# Patient Record
Sex: Male | Born: 1955 | Race: Black or African American | Hispanic: No | Marital: Married | Smoking: Never smoker
Health system: Southern US, Community
[De-identification: ages and names within clinical notes are randomized; demographics above are authoritative.]

## PROBLEM LIST (undated history)

## (undated) DIAGNOSIS — I10 Essential (primary) hypertension: Secondary | ICD-10-CM

## (undated) DIAGNOSIS — I499 Cardiac arrhythmia, unspecified: Secondary | ICD-10-CM

## (undated) DIAGNOSIS — N189 Chronic kidney disease, unspecified: Secondary | ICD-10-CM

## (undated) DIAGNOSIS — K219 Gastro-esophageal reflux disease without esophagitis: Secondary | ICD-10-CM

---

## 2013-10-16 ENCOUNTER — Inpatient Hospital Stay: Payer: Self-pay | Admitting: Internal Medicine

## 2013-10-16 LAB — CBC WITH DIFFERENTIAL/PLATELET
BASOS ABS: 0 10*3/uL (ref 0.0–0.1)
BASOS PCT: 0.3 %
EOS ABS: 0.1 10*3/uL (ref 0.0–0.7)
EOS PCT: 0.9 %
HCT: 42 % (ref 40.0–52.0)
HGB: 13.6 g/dL (ref 13.0–18.0)
LYMPHS PCT: 11.6 %
Lymphocyte #: 1.3 10*3/uL (ref 1.0–3.6)
MCH: 29.9 pg (ref 26.0–34.0)
MCHC: 32.5 g/dL (ref 32.0–36.0)
MCV: 92 fL (ref 80–100)
MONO ABS: 0.7 x10 3/mm (ref 0.2–1.0)
MONOS PCT: 6.1 %
NEUTROS PCT: 81.1 %
Neutrophil #: 8.9 10*3/uL — ABNORMAL HIGH (ref 1.4–6.5)
Platelet: 265 10*3/uL (ref 150–440)
RBC: 4.57 10*6/uL (ref 4.40–5.90)
RDW: 14.6 % — ABNORMAL HIGH (ref 11.5–14.5)
WBC: 10.9 10*3/uL — AB (ref 3.8–10.6)

## 2013-10-16 LAB — COMPREHENSIVE METABOLIC PANEL
AST: 24 U/L (ref 15–37)
Albumin: 3.9 g/dL (ref 3.4–5.0)
Alkaline Phosphatase: 159 U/L — ABNORMAL HIGH
Anion Gap: 9 (ref 7–16)
BUN: 84 mg/dL — AB (ref 7–18)
Bilirubin,Total: 1 mg/dL (ref 0.2–1.0)
CALCIUM: 9.2 mg/dL (ref 8.5–10.1)
Chloride: 90 mmol/L — ABNORMAL LOW (ref 98–107)
Co2: 39 mmol/L — ABNORMAL HIGH (ref 21–32)
Creatinine: 14.97 mg/dL — ABNORMAL HIGH (ref 0.60–1.30)
EGFR (African American): 4 — ABNORMAL LOW
EGFR (Non-African Amer.): 4 — ABNORMAL LOW
Glucose: 169 mg/dL — ABNORMAL HIGH (ref 65–99)
Osmolality: 305 (ref 275–301)
Potassium: 4.6 mmol/L (ref 3.5–5.1)
SGPT (ALT): 33 U/L
SODIUM: 138 mmol/L (ref 136–145)
Total Protein: 8.8 g/dL — ABNORMAL HIGH (ref 6.4–8.2)

## 2013-10-16 LAB — TROPONIN I: Troponin-I: 0.07 ng/mL — ABNORMAL HIGH

## 2013-10-16 LAB — PHENYTOIN LEVEL, TOTAL: DILANTIN: 15.9 ug/mL (ref 10.0–20.0)

## 2013-10-16 LAB — LIPASE, BLOOD: LIPASE: 568 U/L — AB (ref 73–393)

## 2013-10-16 LAB — PHOSPHORUS: PHOSPHORUS: 4 mg/dL (ref 2.5–4.9)

## 2013-10-19 LAB — CBC WITH DIFFERENTIAL/PLATELET
Basophil #: 0.1 10*3/uL (ref 0.0–0.1)
Basophil %: 0.5 %
EOS ABS: 0 10*3/uL (ref 0.0–0.7)
EOS PCT: 0.2 %
HCT: 46.1 % (ref 40.0–52.0)
HGB: 14.8 g/dL (ref 13.0–18.0)
Lymphocyte #: 1.7 10*3/uL (ref 1.0–3.6)
Lymphocyte %: 14.9 %
MCH: 29.7 pg (ref 26.0–34.0)
MCHC: 32.1 g/dL (ref 32.0–36.0)
MCV: 93 fL (ref 80–100)
MONO ABS: 0.9 x10 3/mm (ref 0.2–1.0)
MONOS PCT: 8.3 %
NEUTROS PCT: 76.1 %
Neutrophil #: 8.7 10*3/uL — ABNORMAL HIGH (ref 1.4–6.5)
Platelet: 282 10*3/uL (ref 150–440)
RBC: 4.97 10*6/uL (ref 4.40–5.90)
RDW: 14.7 % — ABNORMAL HIGH (ref 11.5–14.5)
WBC: 11.4 10*3/uL — AB (ref 3.8–10.6)

## 2013-10-19 LAB — PHENYTOIN LEVEL, TOTAL: Dilantin: 12.9 ug/mL (ref 10.0–20.0)

## 2013-10-19 LAB — CK TOTAL AND CKMB (NOT AT ARMC)
CK, Total: 80 U/L
CK-MB: 1.1 ng/mL (ref 0.5–3.6)

## 2013-10-19 LAB — LIPASE, BLOOD: LIPASE: 365 U/L (ref 73–393)

## 2013-10-19 LAB — TROPONIN I: Troponin-I: 0.08 ng/mL — ABNORMAL HIGH

## 2013-10-21 LAB — BASIC METABOLIC PANEL
ANION GAP: 15 (ref 7–16)
BUN: 50 mg/dL — ABNORMAL HIGH (ref 7–18)
CALCIUM: 9 mg/dL (ref 8.5–10.1)
CHLORIDE: 96 mmol/L — AB (ref 98–107)
CO2: 26 mmol/L (ref 21–32)
Creatinine: 9.63 mg/dL — ABNORMAL HIGH (ref 0.60–1.30)
GFR CALC AF AMER: 7 — AB
GFR CALC NON AF AMER: 6 — AB
Glucose: 198 mg/dL — ABNORMAL HIGH (ref 65–99)
OSMOLALITY: 293 (ref 275–301)
POTASSIUM: 5 mmol/L (ref 3.5–5.1)
SODIUM: 137 mmol/L (ref 136–145)

## 2013-10-21 LAB — CBC WITH DIFFERENTIAL/PLATELET
Basophil #: 0.1 10*3/uL (ref 0.0–0.1)
Basophil %: 0.3 %
Eosinophil #: 0 10*3/uL (ref 0.0–0.7)
Eosinophil %: 0 %
HCT: 44.7 % (ref 40.0–52.0)
HGB: 14.7 g/dL (ref 13.0–18.0)
Lymphocyte #: 1.2 10*3/uL (ref 1.0–3.6)
Lymphocyte %: 4.8 %
MCH: 29.8 pg (ref 26.0–34.0)
MCHC: 32.9 g/dL (ref 32.0–36.0)
MCV: 91 fL (ref 80–100)
MONOS PCT: 8.3 %
Monocyte #: 2 x10 3/mm — ABNORMAL HIGH (ref 0.2–1.0)
Neutrophil #: 21.2 10*3/uL — ABNORMAL HIGH (ref 1.4–6.5)
Neutrophil %: 86.6 %
PLATELETS: 345 10*3/uL (ref 150–440)
RBC: 4.92 10*6/uL (ref 4.40–5.90)
RDW: 14.7 % — ABNORMAL HIGH (ref 11.5–14.5)
WBC: 24.5 10*3/uL — AB (ref 3.8–10.6)

## 2013-10-21 LAB — GASTROCCULT (ARMC)
Occult Blood, Gastric: POSITIVE
Ph, Gastric: 4 (ref 1–3)

## 2013-10-21 LAB — RENAL FUNCTION PANEL
Albumin: 3.2 g/dL — ABNORMAL LOW (ref 3.4–5.0)
Anion Gap: 12 (ref 7–16)
BUN: 100 mg/dL — ABNORMAL HIGH (ref 7–18)
Calcium, Total: 8.9 mg/dL (ref 8.5–10.1)
Chloride: 87 mmol/L — ABNORMAL LOW (ref 98–107)
Co2: 33 mmol/L — ABNORMAL HIGH (ref 21–32)
Creatinine: 16.28 mg/dL — ABNORMAL HIGH (ref 0.60–1.30)
Glucose: 225 mg/dL — ABNORMAL HIGH (ref 65–99)
OSMOLALITY: 303 (ref 275–301)
POTASSIUM: 5.8 mmol/L — AB (ref 3.5–5.1)
Phosphorus: 4.8 mg/dL (ref 2.5–4.9)
SODIUM: 132 mmol/L — AB (ref 136–145)

## 2013-10-22 DIAGNOSIS — R079 Chest pain, unspecified: Secondary | ICD-10-CM

## 2013-10-22 LAB — BASIC METABOLIC PANEL
Anion Gap: 12 (ref 7–16)
BUN: 75 mg/dL — ABNORMAL HIGH (ref 7–18)
Calcium, Total: 8.9 mg/dL (ref 8.5–10.1)
Chloride: 96 mmol/L — ABNORMAL LOW (ref 98–107)
Co2: 30 mmol/L (ref 21–32)
Creatinine: 13.23 mg/dL — ABNORMAL HIGH (ref 0.60–1.30)
GLUCOSE: 217 mg/dL — AB (ref 65–99)
Osmolality: 305 (ref 275–301)
POTASSIUM: 5 mmol/L (ref 3.5–5.1)
Sodium: 138 mmol/L (ref 136–145)

## 2013-10-22 LAB — TROPONIN I
TROPONIN-I: 0.13 ng/mL — AB
Troponin-I: 0.13 ng/mL — ABNORMAL HIGH

## 2013-10-22 LAB — CK TOTAL AND CKMB (NOT AT ARMC)
CK, TOTAL: 162 U/L
CK, TOTAL: 177 U/L
CK-MB: 1.9 ng/mL (ref 0.5–3.6)
CK-MB: 1.8 ng/mL (ref 0.5–3.6)

## 2013-10-22 LAB — PHENYTOIN LEVEL, TOTAL: DILANTIN: 7.4 ug/mL — AB (ref 10.0–20.0)

## 2013-10-22 LAB — GLUCOSE, RANDOM: GLUCOSE: 182 mg/dL — AB (ref 65–99)

## 2013-10-23 LAB — CBC WITH DIFFERENTIAL/PLATELET
BASOS ABS: 0.1 10*3/uL (ref 0.0–0.1)
BASOS ABS: 0.2 10*3/uL — AB (ref 0.0–0.1)
Basophil %: 0.4 %
Basophil %: 0.7 %
EOS PCT: 0.3 %
Eosinophil #: 0 10*3/uL (ref 0.0–0.7)
Eosinophil #: 0.1 10*3/uL (ref 0.0–0.7)
Eosinophil %: 0.2 %
HCT: 34.8 % — ABNORMAL LOW (ref 40.0–52.0)
HCT: 35.1 % — ABNORMAL LOW (ref 40.0–52.0)
HGB: 11.3 g/dL — ABNORMAL LOW (ref 13.0–18.0)
HGB: 11 g/dL — ABNORMAL LOW (ref 13.0–18.0)
LYMPHS ABS: 2.3 10*3/uL (ref 1.0–3.6)
Lymphocyte #: 2.8 10*3/uL (ref 1.0–3.6)
Lymphocyte %: 10.3 %
Lymphocyte %: 12.3 %
MCH: 29.9 pg (ref 26.0–34.0)
MCH: 29.5 pg (ref 26.0–34.0)
MCHC: 32.6 g/dL (ref 32.0–36.0)
MCHC: 31.4 g/dL — ABNORMAL LOW (ref 32.0–36.0)
MCV: 92 fL (ref 80–100)
MCV: 94 fL (ref 80–100)
MONOS PCT: 8.8 %
MONOS PCT: 8.3 %
Monocyte #: 1.9 x10 3/mm — ABNORMAL HIGH (ref 0.2–1.0)
Monocyte #: 1.9 x10 3/mm — ABNORMAL HIGH (ref 0.2–1.0)
NEUTROS PCT: 80.3 %
Neutrophil #: 17.6 10*3/uL — ABNORMAL HIGH (ref 1.4–6.5)
Neutrophil #: 17.6 10*3/uL — ABNORMAL HIGH (ref 1.4–6.5)
Neutrophil %: 78.4 %
PLATELETS: 281 10*3/uL (ref 150–440)
PLATELETS: 280 10*3/uL (ref 150–440)
RBC: 3.8 10*6/uL — ABNORMAL LOW (ref 4.40–5.90)
RBC: 3.74 10*6/uL — AB (ref 4.40–5.90)
RDW: 14.5 % (ref 11.5–14.5)
RDW: 14.6 % — AB (ref 11.5–14.5)
WBC: 21.9 10*3/uL — ABNORMAL HIGH (ref 3.8–10.6)
WBC: 22.5 10*3/uL — AB (ref 3.8–10.6)

## 2013-10-23 LAB — CK TOTAL AND CKMB (NOT AT ARMC)
CK, Total: 154 U/L
CK-MB: 1.7 ng/mL (ref 0.5–3.6)

## 2013-10-23 LAB — RENAL FUNCTION PANEL
ALBUMIN: 2.6 g/dL — AB (ref 3.4–5.0)
Anion Gap: 13 (ref 7–16)
BUN: 83 mg/dL — ABNORMAL HIGH (ref 7–18)
CALCIUM: 8.8 mg/dL (ref 8.5–10.1)
CHLORIDE: 95 mmol/L — AB (ref 98–107)
CREATININE: 14.81 mg/dL — AB (ref 0.60–1.30)
Co2: 32 mmol/L (ref 21–32)
EGFR (African American): 4 — ABNORMAL LOW
GFR CALC NON AF AMER: 4 — AB
Glucose: 132 mg/dL — ABNORMAL HIGH (ref 65–99)
OSMOLALITY: 306 (ref 275–301)
PHOSPHORUS: 6.2 mg/dL — AB (ref 2.5–4.9)
Potassium: 4.7 mmol/L (ref 3.5–5.1)
Sodium: 140 mmol/L (ref 136–145)

## 2013-10-23 LAB — GLUCOSE, RANDOM
Glucose: 139 mg/dL — ABNORMAL HIGH (ref 65–99)
Glucose: 231 mg/dL — ABNORMAL HIGH (ref 65–99)

## 2013-10-23 LAB — TROPONIN I: TROPONIN-I: 0.12 ng/mL — AB

## 2013-10-23 LAB — RAPID HIV SCREEN (HIV 1/2 AB+AG)

## 2013-10-24 LAB — CBC WITH DIFFERENTIAL/PLATELET
BASOS PCT: 0.6 %
Basophil #: 0.1 10*3/uL (ref 0.0–0.1)
Eosinophil #: 0.2 10*3/uL (ref 0.0–0.7)
Eosinophil %: 1 %
HCT: 29.5 % — ABNORMAL LOW (ref 40.0–52.0)
HGB: 9.5 g/dL — ABNORMAL LOW (ref 13.0–18.0)
LYMPHS PCT: 12.4 %
Lymphocyte #: 1.9 10*3/uL (ref 1.0–3.6)
MCH: 30 pg (ref 26.0–34.0)
MCHC: 32.3 g/dL (ref 32.0–36.0)
MCV: 93 fL (ref 80–100)
MONO ABS: 1.3 x10 3/mm — AB (ref 0.2–1.0)
MONOS PCT: 8.1 %
NEUTROS ABS: 12.1 10*3/uL — AB (ref 1.4–6.5)
NEUTROS PCT: 77.9 %
Platelet: 240 10*3/uL (ref 150–440)
RBC: 3.18 10*6/uL — ABNORMAL LOW (ref 4.40–5.90)
RDW: 14.1 % (ref 11.5–14.5)
WBC: 15.5 10*3/uL — ABNORMAL HIGH (ref 3.8–10.6)

## 2013-10-24 LAB — PHENYTOIN LEVEL, TOTAL: Dilantin: 8.4 ug/mL — ABNORMAL LOW (ref 10.0–20.0)

## 2013-10-25 LAB — CBC WITH DIFFERENTIAL/PLATELET
Basophil #: 0 10*3/uL (ref 0.0–0.1)
Basophil %: 0.2 %
Eosinophil #: 0.2 10*3/uL (ref 0.0–0.7)
Eosinophil %: 1.4 %
HCT: 28.4 % — AB (ref 40.0–52.0)
HGB: 9.2 g/dL — ABNORMAL LOW (ref 13.0–18.0)
LYMPHS ABS: 2.1 10*3/uL (ref 1.0–3.6)
Lymphocyte %: 14 %
MCH: 30.1 pg (ref 26.0–34.0)
MCHC: 32.6 g/dL (ref 32.0–36.0)
MCV: 92 fL (ref 80–100)
Monocyte #: 1.6 x10 3/mm — ABNORMAL HIGH (ref 0.2–1.0)
Monocyte %: 10.9 %
Neutrophil #: 11.1 10*3/uL — ABNORMAL HIGH (ref 1.4–6.5)
Neutrophil %: 73.5 %
Platelet: 242 10*3/uL (ref 150–440)
RBC: 3.07 10*6/uL — AB (ref 4.40–5.90)
RDW: 14.1 % (ref 11.5–14.5)
WBC: 15.1 10*3/uL — ABNORMAL HIGH (ref 3.8–10.6)

## 2013-10-25 LAB — RENAL FUNCTION PANEL
ALBUMIN: 2.2 g/dL — AB (ref 3.4–5.0)
Anion Gap: 11 (ref 7–16)
BUN: 81 mg/dL — ABNORMAL HIGH (ref 7–18)
CALCIUM: 8 mg/dL — AB (ref 8.5–10.1)
CHLORIDE: 96 mmol/L — AB (ref 98–107)
CREATININE: 13.15 mg/dL — AB (ref 0.60–1.30)
Co2: 30 mmol/L (ref 21–32)
EGFR (African American): 5 — ABNORMAL LOW
GFR CALC NON AF AMER: 4 — AB
Glucose: 177 mg/dL — ABNORMAL HIGH (ref 65–99)
Osmolality: 303 (ref 275–301)
PHOSPHORUS: 4.4 mg/dL (ref 2.5–4.9)
Potassium: 3.8 mmol/L (ref 3.5–5.1)
Sodium: 137 mmol/L (ref 136–145)

## 2013-10-25 LAB — PHENYTOIN LEVEL, TOTAL: DILANTIN: 8.8 ug/mL — AB (ref 10.0–20.0)

## 2013-11-01 ENCOUNTER — Emergency Department: Payer: Self-pay | Admitting: Student

## 2013-11-01 LAB — BASIC METABOLIC PANEL
ANION GAP: 9 (ref 7–16)
BUN: 35 mg/dL — ABNORMAL HIGH (ref 7–18)
CHLORIDE: 103 mmol/L (ref 98–107)
Calcium, Total: 7.7 mg/dL — ABNORMAL LOW (ref 8.5–10.1)
Co2: 28 mmol/L (ref 21–32)
Creatinine: 10.01 mg/dL — ABNORMAL HIGH (ref 0.60–1.30)
EGFR (African American): 7 — ABNORMAL LOW
EGFR (Non-African Amer.): 6 — ABNORMAL LOW
Glucose: 110 mg/dL — ABNORMAL HIGH (ref 65–99)
OSMOLALITY: 288 (ref 275–301)
Potassium: 4.2 mmol/L (ref 3.5–5.1)
SODIUM: 140 mmol/L (ref 136–145)

## 2013-11-01 LAB — CBC
HCT: 26 % — AB (ref 40.0–52.0)
HGB: 8.2 g/dL — AB (ref 13.0–18.0)
MCH: 29.5 pg (ref 26.0–34.0)
MCHC: 31.5 g/dL — AB (ref 32.0–36.0)
MCV: 94 fL (ref 80–100)
Platelet: 422 10*3/uL (ref 150–440)
RBC: 2.77 10*6/uL — AB (ref 4.40–5.90)
RDW: 14.4 % (ref 11.5–14.5)
WBC: 10.5 10*3/uL (ref 3.8–10.6)

## 2013-11-05 LAB — CBC WITH DIFFERENTIAL/PLATELET
Basophil #: 0.1 10*3/uL (ref 0.0–0.1)
Basophil %: 0.8 %
EOS PCT: 2.2 %
Eosinophil #: 0.2 10*3/uL (ref 0.0–0.7)
HCT: 28.7 % — AB (ref 40.0–52.0)
HGB: 9.1 g/dL — AB (ref 13.0–18.0)
LYMPHS PCT: 18.6 %
Lymphocyte #: 1.7 10*3/uL (ref 1.0–3.6)
MCH: 30 pg (ref 26.0–34.0)
MCHC: 31.9 g/dL — ABNORMAL LOW (ref 32.0–36.0)
MCV: 94 fL (ref 80–100)
Monocyte #: 0.8 x10 3/mm (ref 0.2–1.0)
Monocyte %: 9.4 %
NEUTROS PCT: 69 %
Neutrophil #: 6.2 10*3/uL (ref 1.4–6.5)
Platelet: 513 10*3/uL — ABNORMAL HIGH (ref 150–440)
RBC: 3.04 10*6/uL — ABNORMAL LOW (ref 4.40–5.90)
RDW: 15 % — ABNORMAL HIGH (ref 11.5–14.5)
WBC: 8.9 10*3/uL (ref 3.8–10.6)

## 2013-11-06 ENCOUNTER — Inpatient Hospital Stay: Payer: Self-pay | Admitting: Specialist

## 2013-11-06 LAB — COMPREHENSIVE METABOLIC PANEL
ALT: 65 U/L — AB
ANION GAP: 10 (ref 7–16)
Albumin: 2.8 g/dL — ABNORMAL LOW (ref 3.4–5.0)
Alkaline Phosphatase: 152 U/L — ABNORMAL HIGH
BILIRUBIN TOTAL: 0.3 mg/dL (ref 0.2–1.0)
BUN: 40 mg/dL — AB (ref 7–18)
CALCIUM: 8 mg/dL — AB (ref 8.5–10.1)
CREATININE: 10.98 mg/dL — AB (ref 0.60–1.30)
Chloride: 110 mmol/L — ABNORMAL HIGH (ref 98–107)
Co2: 20 mmol/L — ABNORMAL LOW (ref 21–32)
GFR CALC AF AMER: 6 — AB
GFR CALC NON AF AMER: 5 — AB
GLUCOSE: 104 mg/dL — AB (ref 65–99)
OSMOLALITY: 289 (ref 275–301)
Potassium: 4.6 mmol/L (ref 3.5–5.1)
SGOT(AST): 29 U/L (ref 15–37)
SODIUM: 140 mmol/L (ref 136–145)
TOTAL PROTEIN: 7.8 g/dL (ref 6.4–8.2)

## 2013-11-06 LAB — PHENYTOIN LEVEL, TOTAL: DILANTIN: 26.7 ug/mL — AB (ref 10.0–20.0)

## 2013-11-06 LAB — TROPONIN I: TROPONIN-I: 0.04 ng/mL

## 2013-11-06 LAB — PHOSPHORUS: PHOSPHORUS: 4.1 mg/dL (ref 2.5–4.9)

## 2013-11-12 ENCOUNTER — Emergency Department: Payer: Self-pay | Admitting: Emergency Medicine

## 2013-11-12 LAB — COMPREHENSIVE METABOLIC PANEL
ALT: 39 U/L
Albumin: 2.7 g/dL — ABNORMAL LOW (ref 3.4–5.0)
Alkaline Phosphatase: 177 U/L — ABNORMAL HIGH
Anion Gap: 8 (ref 7–16)
BUN: 24 mg/dL — ABNORMAL HIGH (ref 7–18)
Bilirubin,Total: 0.3 mg/dL (ref 0.2–1.0)
CHLORIDE: 104 mmol/L (ref 98–107)
CO2: 25 mmol/L (ref 21–32)
Calcium, Total: 8.1 mg/dL — ABNORMAL LOW (ref 8.5–10.1)
Creatinine: 8.23 mg/dL — ABNORMAL HIGH (ref 0.60–1.30)
Glucose: 189 mg/dL — ABNORMAL HIGH (ref 65–99)
Potassium: 5.5 mmol/L — ABNORMAL HIGH (ref 3.5–5.1)
SGOT(AST): 51 U/L — ABNORMAL HIGH (ref 15–37)
Sodium: 137 mmol/L (ref 136–145)
TOTAL PROTEIN: 7.2 g/dL (ref 6.4–8.2)

## 2013-11-12 LAB — CBC
HCT: 30 % — ABNORMAL LOW (ref 40.0–52.0)
HGB: 9.5 g/dL — ABNORMAL LOW (ref 13.0–18.0)
MCH: 30.1 pg (ref 26.0–34.0)
MCHC: 31.7 g/dL — ABNORMAL LOW (ref 32.0–36.0)
MCV: 95 fL (ref 80–100)
Platelet: 357 10*3/uL (ref 150–440)
RBC: 3.16 10*6/uL — ABNORMAL LOW (ref 4.40–5.90)
RDW: 15.1 % — ABNORMAL HIGH (ref 11.5–14.5)
WBC: 6.7 10*3/uL (ref 3.8–10.6)

## 2013-11-12 LAB — TROPONIN I: Troponin-I: 0.05 ng/mL

## 2013-11-15 ENCOUNTER — Emergency Department: Payer: Self-pay | Admitting: Emergency Medicine

## 2013-11-15 LAB — COMPREHENSIVE METABOLIC PANEL
ALK PHOS: 178 U/L — AB
ANION GAP: 9 (ref 7–16)
AST: 28 U/L (ref 15–37)
Albumin: 2.5 g/dL — ABNORMAL LOW (ref 3.4–5.0)
BUN: 44 mg/dL — ABNORMAL HIGH (ref 7–18)
Bilirubin,Total: 0.3 mg/dL (ref 0.2–1.0)
CHLORIDE: 106 mmol/L (ref 98–107)
Calcium, Total: 8.1 mg/dL — ABNORMAL LOW (ref 8.5–10.1)
Co2: 25 mmol/L (ref 21–32)
Creatinine: 13.66 mg/dL — ABNORMAL HIGH (ref 0.60–1.30)
GFR CALC AF AMER: 5 — AB
GFR CALC NON AF AMER: 4 — AB
GLUCOSE: 196 mg/dL — AB (ref 65–99)
Osmolality: 296 (ref 275–301)
Potassium: 4.8 mmol/L (ref 3.5–5.1)
SGPT (ALT): 30 U/L
SODIUM: 140 mmol/L (ref 136–145)
Total Protein: 7.2 g/dL (ref 6.4–8.2)

## 2013-11-15 LAB — CBC
HCT: 25.4 % — AB (ref 40.0–52.0)
HGB: 8.2 g/dL — ABNORMAL LOW (ref 13.0–18.0)
MCH: 30.3 pg (ref 26.0–34.0)
MCHC: 32.2 g/dL (ref 32.0–36.0)
MCV: 94 fL (ref 80–100)
Platelet: 348 10*3/uL (ref 150–440)
RBC: 2.7 10*6/uL — ABNORMAL LOW (ref 4.40–5.90)
RDW: 15.1 % — AB (ref 11.5–14.5)
WBC: 7 10*3/uL (ref 3.8–10.6)

## 2013-11-15 LAB — TROPONIN I: TROPONIN-I: 0.05 ng/mL

## 2013-11-16 ENCOUNTER — Inpatient Hospital Stay: Payer: Self-pay | Admitting: Otolaryngology

## 2013-11-16 LAB — COMPREHENSIVE METABOLIC PANEL
ALK PHOS: 191 U/L — AB
Albumin: 2.5 g/dL — ABNORMAL LOW (ref 3.4–5.0)
Anion Gap: 10 (ref 7–16)
BILIRUBIN TOTAL: 0.3 mg/dL (ref 0.2–1.0)
BUN: 52 mg/dL — ABNORMAL HIGH (ref 7–18)
CHLORIDE: 107 mmol/L (ref 98–107)
Calcium, Total: 8.2 mg/dL — ABNORMAL LOW (ref 8.5–10.1)
Co2: 21 mmol/L (ref 21–32)
Creatinine: 14.83 mg/dL — ABNORMAL HIGH (ref 0.60–1.30)
EGFR (African American): 4 — ABNORMAL LOW
EGFR (Non-African Amer.): 4 — ABNORMAL LOW
Glucose: 120 mg/dL — ABNORMAL HIGH (ref 65–99)
OSMOLALITY: 291 (ref 275–301)
POTASSIUM: 5.8 mmol/L — AB (ref 3.5–5.1)
SGOT(AST): 42 U/L — ABNORMAL HIGH (ref 15–37)
SGPT (ALT): 31 U/L
Sodium: 138 mmol/L (ref 136–145)
Total Protein: 7.5 g/dL (ref 6.4–8.2)

## 2013-11-16 LAB — CBC WITH DIFFERENTIAL/PLATELET
BASOS ABS: 0.1 10*3/uL (ref 0.0–0.1)
BASOS PCT: 0.7 %
EOS ABS: 0.4 10*3/uL (ref 0.0–0.7)
EOS PCT: 4.9 %
HCT: 27.5 % — ABNORMAL LOW (ref 40.0–52.0)
HGB: 8.9 g/dL — AB (ref 13.0–18.0)
LYMPHS PCT: 25.9 %
Lymphocyte #: 2 10*3/uL (ref 1.0–3.6)
MCH: 30.4 pg (ref 26.0–34.0)
MCHC: 32.5 g/dL (ref 32.0–36.0)
MCV: 94 fL (ref 80–100)
MONO ABS: 1 x10 3/mm (ref 0.2–1.0)
MONOS PCT: 12.2 %
Neutrophil #: 4.4 10*3/uL (ref 1.4–6.5)
Neutrophil %: 56.3 %
PLATELETS: 415 10*3/uL (ref 150–440)
RBC: 2.93 10*6/uL — ABNORMAL LOW (ref 4.40–5.90)
RDW: 15.4 % — AB (ref 11.5–14.5)
WBC: 7.8 10*3/uL (ref 3.8–10.6)

## 2013-11-17 LAB — COMPREHENSIVE METABOLIC PANEL
ALT: 24 U/L
ANION GAP: 10 (ref 7–16)
Albumin: 2.2 g/dL — ABNORMAL LOW (ref 3.4–5.0)
Alkaline Phosphatase: 173 U/L — ABNORMAL HIGH
BILIRUBIN TOTAL: 0.3 mg/dL (ref 0.2–1.0)
BUN: 62 mg/dL — ABNORMAL HIGH (ref 7–18)
CHLORIDE: 107 mmol/L (ref 98–107)
CO2: 20 mmol/L — AB (ref 21–32)
Calcium, Total: 8 mg/dL — ABNORMAL LOW (ref 8.5–10.1)
Creatinine: 16.24 mg/dL — ABNORMAL HIGH (ref 0.60–1.30)
EGFR (African American): 4 — ABNORMAL LOW
EGFR (Non-African Amer.): 3 — ABNORMAL LOW
Glucose: 95 mg/dL (ref 65–99)
Osmolality: 291 (ref 275–301)
Potassium: 5.4 mmol/L — ABNORMAL HIGH (ref 3.5–5.1)
SGOT(AST): 21 U/L (ref 15–37)
SODIUM: 137 mmol/L (ref 136–145)
TOTAL PROTEIN: 6.4 g/dL (ref 6.4–8.2)

## 2013-11-17 LAB — CBC WITH DIFFERENTIAL/PLATELET
Basophil #: 0 10*3/uL (ref 0.0–0.1)
Basophil %: 0.3 %
EOS ABS: 0 10*3/uL (ref 0.0–0.7)
EOS PCT: 0 %
HCT: 23.4 % — ABNORMAL LOW (ref 40.0–52.0)
HGB: 7.7 g/dL — ABNORMAL LOW (ref 13.0–18.0)
LYMPHS ABS: 0.6 10*3/uL — AB (ref 1.0–3.6)
Lymphocyte %: 8.7 %
MCH: 30.4 pg (ref 26.0–34.0)
MCHC: 33 g/dL (ref 32.0–36.0)
MCV: 92 fL (ref 80–100)
Monocyte #: 0.2 x10 3/mm (ref 0.2–1.0)
Monocyte %: 3.5 %
Neutrophil #: 5.6 10*3/uL (ref 1.4–6.5)
Neutrophil %: 87.5 %
Platelet: 329 10*3/uL (ref 150–440)
RBC: 2.54 10*6/uL — ABNORMAL LOW (ref 4.40–5.90)
RDW: 15 % — ABNORMAL HIGH (ref 11.5–14.5)
WBC: 6.4 10*3/uL (ref 3.8–10.6)

## 2013-11-17 LAB — PHENYTOIN LEVEL, TOTAL: Dilantin: 24.8 ug/mL — ABNORMAL HIGH (ref 10.0–20.0)

## 2013-11-17 LAB — PHOSPHORUS: PHOSPHORUS: 4.1 mg/dL (ref 2.5–4.9)

## 2013-11-17 LAB — TRIGLYCERIDES: TRIGLYCERIDES: 212 mg/dL — AB (ref 0–200)

## 2013-11-17 LAB — MAGNESIUM: Magnesium: 2.7 mg/dL — ABNORMAL HIGH

## 2013-11-18 LAB — RENAL FUNCTION PANEL
ALBUMIN: 2.3 g/dL — AB (ref 3.4–5.0)
ANION GAP: 15 (ref 7–16)
BUN: 78 mg/dL — ABNORMAL HIGH (ref 7–18)
CALCIUM: 8.1 mg/dL — AB (ref 8.5–10.1)
CHLORIDE: 103 mmol/L (ref 98–107)
CREATININE: 17.26 mg/dL — AB (ref 0.60–1.30)
Co2: 19 mmol/L — ABNORMAL LOW (ref 21–32)
EGFR (African American): 4 — ABNORMAL LOW
EGFR (Non-African Amer.): 3 — ABNORMAL LOW
GLUCOSE: 103 mg/dL — AB (ref 65–99)
OSMOLALITY: 297 (ref 275–301)
POTASSIUM: 5.2 mmol/L — AB (ref 3.5–5.1)
Phosphorus: 5.7 mg/dL — ABNORMAL HIGH (ref 2.5–4.9)
SODIUM: 137 mmol/L (ref 136–145)

## 2013-11-18 LAB — CBC WITH DIFFERENTIAL/PLATELET
BASOS ABS: 0 10*3/uL (ref 0.0–0.1)
BASOS ABS: 0 10*3/uL (ref 0.0–0.1)
Basophil %: 0.3 %
Basophil %: 0.4 %
EOS PCT: 0.1 %
Eosinophil #: 0 10*3/uL (ref 0.0–0.7)
Eosinophil #: 0 10*3/uL (ref 0.0–0.7)
Eosinophil %: 0 %
HCT: 27.5 % — AB (ref 40.0–52.0)
HCT: 30.4 % — ABNORMAL LOW (ref 40.0–52.0)
HGB: 9 g/dL — ABNORMAL LOW (ref 13.0–18.0)
HGB: 9.7 g/dL — ABNORMAL LOW (ref 13.0–18.0)
LYMPHS ABS: 1 10*3/uL (ref 1.0–3.6)
LYMPHS PCT: 6.2 %
Lymphocyte #: 0.5 10*3/uL — ABNORMAL LOW (ref 1.0–3.6)
Lymphocyte %: 13.6 %
MCH: 30.4 pg (ref 26.0–34.0)
MCH: 29.9 pg (ref 26.0–34.0)
MCHC: 32.7 g/dL (ref 32.0–36.0)
MCHC: 31.8 g/dL — AB (ref 32.0–36.0)
MCV: 93 fL (ref 80–100)
MCV: 94 fL (ref 80–100)
MONOS PCT: 6.8 %
Monocyte #: 0.5 x10 3/mm (ref 0.2–1.0)
Monocyte #: 0.3 x10 3/mm (ref 0.2–1.0)
Monocyte %: 3.7 %
NEUTROS ABS: 7.8 10*3/uL — AB (ref 1.4–6.5)
Neutrophil #: 5.7 10*3/uL (ref 1.4–6.5)
Neutrophil %: 79.2 %
Neutrophil %: 89.7 %
Platelet: 393 10*3/uL (ref 150–440)
Platelet: 467 10*3/uL — ABNORMAL HIGH (ref 150–440)
RBC: 2.95 10*6/uL — ABNORMAL LOW (ref 4.40–5.90)
RBC: 3.24 10*6/uL — AB (ref 4.40–5.90)
RDW: 15.1 % — ABNORMAL HIGH (ref 11.5–14.5)
RDW: 15.1 % — ABNORMAL HIGH (ref 11.5–14.5)
WBC: 7.2 10*3/uL (ref 3.8–10.6)
WBC: 8.7 10*3/uL (ref 3.8–10.6)

## 2013-11-18 LAB — IRON AND TIBC
IRON BIND. CAP.(TOTAL): 128 ug/dL — AB (ref 250–450)
IRON: 76 ug/dL (ref 65–175)
Iron Saturation: 59 %
Unbound Iron-Bind.Cap.: 52 ug/dL

## 2013-11-18 LAB — BASIC METABOLIC PANEL
Anion Gap: 14 (ref 7–16)
BUN: 79 mg/dL — AB (ref 7–18)
CHLORIDE: 104 mmol/L (ref 98–107)
CREATININE: 17.14 mg/dL — AB (ref 0.60–1.30)
Calcium, Total: 8.1 mg/dL — ABNORMAL LOW (ref 8.5–10.1)
Co2: 19 mmol/L — ABNORMAL LOW (ref 21–32)
EGFR (African American): 4 — ABNORMAL LOW
GFR CALC NON AF AMER: 3 — AB
Glucose: 106 mg/dL — ABNORMAL HIGH (ref 65–99)
Osmolality: 298 (ref 275–301)
POTASSIUM: 5.2 mmol/L — AB (ref 3.5–5.1)
Sodium: 137 mmol/L (ref 136–145)

## 2013-11-18 LAB — FERRITIN: Ferritin (ARMC): 809 ng/mL — ABNORMAL HIGH (ref 8–388)

## 2013-11-18 LAB — PHENYTOIN LEVEL, TOTAL: Dilantin: 24.3 ug/mL — ABNORMAL HIGH (ref 10.0–20.0)

## 2013-11-18 LAB — MAGNESIUM: MAGNESIUM: 2.8 mg/dL — AB

## 2013-11-18 LAB — PHOSPHORUS: Phosphorus: 5.7 mg/dL — ABNORMAL HIGH (ref 2.5–4.9)

## 2013-11-18 LAB — ALBUMIN: Albumin: 2.3 g/dL — ABNORMAL LOW (ref 3.4–5.0)

## 2013-11-19 LAB — CBC WITH DIFFERENTIAL/PLATELET
Basophil #: 0.1 10*3/uL (ref 0.0–0.1)
Basophil %: 0.8 %
Eosinophil #: 0 10*3/uL (ref 0.0–0.7)
Eosinophil %: 0.1 %
HCT: 27.2 % — ABNORMAL LOW (ref 40.0–52.0)
HGB: 9.2 g/dL — AB (ref 13.0–18.0)
LYMPHS ABS: 0.9 10*3/uL — AB (ref 1.0–3.6)
Lymphocyte %: 9.2 %
MCH: 31 pg (ref 26.0–34.0)
MCHC: 33.8 g/dL (ref 32.0–36.0)
MCV: 92 fL (ref 80–100)
Monocyte #: 1.1 x10 3/mm — ABNORMAL HIGH (ref 0.2–1.0)
Monocyte %: 11.1 %
Neutrophil #: 8.1 10*3/uL — ABNORMAL HIGH (ref 1.4–6.5)
Neutrophil %: 78.8 %
PLATELETS: 388 10*3/uL (ref 150–440)
RBC: 2.97 10*6/uL — ABNORMAL LOW (ref 4.40–5.90)
RDW: 15.3 % — ABNORMAL HIGH (ref 11.5–14.5)
WBC: 10.3 10*3/uL (ref 3.8–10.6)

## 2013-11-19 LAB — BASIC METABOLIC PANEL
ANION GAP: 13 (ref 7–16)
BUN: 42 mg/dL — ABNORMAL HIGH (ref 7–18)
CALCIUM: 7.5 mg/dL — AB (ref 8.5–10.1)
CREATININE: 11.25 mg/dL — AB (ref 0.60–1.30)
Chloride: 98 mmol/L (ref 98–107)
Co2: 26 mmol/L (ref 21–32)
EGFR (African American): 6 — ABNORMAL LOW
EGFR (Non-African Amer.): 5 — ABNORMAL LOW
GLUCOSE: 140 mg/dL — AB (ref 65–99)
OSMOLALITY: 287 (ref 275–301)
POTASSIUM: 4 mmol/L (ref 3.5–5.1)
Sodium: 137 mmol/L (ref 136–145)

## 2013-11-19 LAB — PHOSPHORUS: Phosphorus: 5.5 mg/dL — ABNORMAL HIGH (ref 2.5–4.9)

## 2013-11-19 LAB — MAGNESIUM: MAGNESIUM: 2.3 mg/dL

## 2013-11-19 LAB — ALBUMIN: ALBUMIN: 2.4 g/dL — AB (ref 3.4–5.0)

## 2013-11-19 LAB — PHENYTOIN LEVEL, TOTAL: DILANTIN: 18.6 ug/mL (ref 10.0–20.0)

## 2013-11-20 LAB — CBC WITH DIFFERENTIAL/PLATELET
Basophil #: 0 10*3/uL (ref 0.0–0.1)
Basophil %: 0.2 %
EOS ABS: 0.1 10*3/uL (ref 0.0–0.7)
EOS PCT: 1.9 %
HCT: 25.5 % — ABNORMAL LOW (ref 40.0–52.0)
HGB: 8.4 g/dL — ABNORMAL LOW (ref 13.0–18.0)
LYMPHS PCT: 25.6 %
Lymphocyte #: 2 10*3/uL (ref 1.0–3.6)
MCH: 30.8 pg (ref 26.0–34.0)
MCHC: 32.8 g/dL (ref 32.0–36.0)
MCV: 94 fL (ref 80–100)
MONOS PCT: 12.6 %
Monocyte #: 1 x10 3/mm (ref 0.2–1.0)
Neutrophil #: 4.8 10*3/uL (ref 1.4–6.5)
Neutrophil %: 59.7 %
Platelet: 310 10*3/uL (ref 150–440)
RBC: 2.72 10*6/uL — ABNORMAL LOW (ref 4.40–5.90)
RDW: 15.2 % — ABNORMAL HIGH (ref 11.5–14.5)
WBC: 8 10*3/uL (ref 3.8–10.6)

## 2013-11-20 LAB — RENAL FUNCTION PANEL
ALBUMIN: 2.1 g/dL — AB (ref 3.4–5.0)
Anion Gap: 13 (ref 7–16)
BUN: 66 mg/dL — ABNORMAL HIGH (ref 7–18)
CO2: 25 mmol/L (ref 21–32)
Calcium, Total: 7.9 mg/dL — ABNORMAL LOW (ref 8.5–10.1)
Chloride: 101 mmol/L (ref 98–107)
Creatinine: 13.23 mg/dL — ABNORMAL HIGH (ref 0.60–1.30)
GFR CALC AF AMER: 5 — AB
GFR CALC NON AF AMER: 4 — AB
GLUCOSE: 160 mg/dL — AB (ref 65–99)
Osmolality: 300 (ref 275–301)
POTASSIUM: 4.2 mmol/L (ref 3.5–5.1)
Phosphorus: 6.1 mg/dL — ABNORMAL HIGH (ref 2.5–4.9)
Sodium: 139 mmol/L (ref 136–145)

## 2013-11-21 LAB — PHENYTOIN LEVEL, TOTAL: Dilantin: 10.2 ug/mL (ref 10.0–20.0)

## 2013-11-21 LAB — ALBUMIN: Albumin: 2.1 g/dL — ABNORMAL LOW (ref 3.4–5.0)

## 2013-11-22 LAB — RENAL FUNCTION PANEL
ALBUMIN: 2 g/dL — AB (ref 3.4–5.0)
ANION GAP: 14 (ref 7–16)
BUN: 64 mg/dL — ABNORMAL HIGH (ref 7–18)
CREATININE: 10.87 mg/dL — AB (ref 0.60–1.30)
Calcium, Total: 8.4 mg/dL — ABNORMAL LOW (ref 8.5–10.1)
Chloride: 98 mmol/L (ref 98–107)
Co2: 27 mmol/L (ref 21–32)
EGFR (African American): 6 — ABNORMAL LOW
EGFR (Non-African Amer.): 5 — ABNORMAL LOW
Glucose: 167 mg/dL — ABNORMAL HIGH (ref 65–99)
Osmolality: 300 (ref 275–301)
PHOSPHORUS: 5.2 mg/dL — AB (ref 2.5–4.9)
Potassium: 4.3 mmol/L (ref 3.5–5.1)
SODIUM: 139 mmol/L (ref 136–145)

## 2013-11-22 LAB — ALBUMIN: Albumin: 2.1 g/dL — ABNORMAL LOW (ref 3.4–5.0)

## 2013-11-22 LAB — CBC WITH DIFFERENTIAL/PLATELET
Basophil #: 0 10*3/uL (ref 0.0–0.1)
Basophil %: 0.2 %
Eosinophil #: 0.2 10*3/uL (ref 0.0–0.7)
Eosinophil %: 2.5 %
HCT: 24.9 % — AB (ref 40.0–52.0)
HGB: 8.3 g/dL — ABNORMAL LOW (ref 13.0–18.0)
LYMPHS ABS: 1.8 10*3/uL (ref 1.0–3.6)
LYMPHS PCT: 20.1 %
MCH: 31.2 pg (ref 26.0–34.0)
MCHC: 33.3 g/dL (ref 32.0–36.0)
MCV: 94 fL (ref 80–100)
Monocyte #: 1.1 x10 3/mm — ABNORMAL HIGH (ref 0.2–1.0)
Monocyte %: 11.8 %
Neutrophil #: 5.9 10*3/uL (ref 1.4–6.5)
Neutrophil %: 65.4 %
Platelet: 338 10*3/uL (ref 150–440)
RBC: 2.66 10*6/uL — AB (ref 4.40–5.90)
RDW: 15 % — AB (ref 11.5–14.5)
WBC: 9 10*3/uL (ref 3.8–10.6)

## 2013-11-22 LAB — PHENYTOIN LEVEL, TOTAL: Dilantin: 8.8 ug/mL — ABNORMAL LOW (ref 10.0–20.0)

## 2013-11-23 LAB — URIC ACID: URIC ACID: 3.5 mg/dL (ref 3.5–7.2)

## 2013-11-24 LAB — PHENYTOIN LEVEL, TOTAL: Dilantin: 5.1 ug/mL — ABNORMAL LOW (ref 10.0–20.0)

## 2013-11-24 LAB — ALBUMIN: ALBUMIN: 2.1 g/dL — AB (ref 3.4–5.0)

## 2013-11-25 LAB — CBC WITH DIFFERENTIAL/PLATELET
Basophil #: 0.1 10*3/uL (ref 0.0–0.1)
Basophil #: 0 10*3/uL (ref 0.0–0.1)
Basophil %: 0.9 %
Basophil %: 0.4 %
Eosinophil #: 0 10*3/uL (ref 0.0–0.7)
Eosinophil #: 0 10*3/uL (ref 0.0–0.7)
Eosinophil %: 0.1 %
Eosinophil %: 0.2 %
HCT: 23.9 % — ABNORMAL LOW (ref 40.0–52.0)
HCT: 22.7 % — AB (ref 40.0–52.0)
HGB: 8 g/dL — AB (ref 13.0–18.0)
HGB: 7.6 g/dL — ABNORMAL LOW (ref 13.0–18.0)
LYMPHS PCT: 5.7 %
LYMPHS PCT: 7.2 %
Lymphocyte #: 0.5 10*3/uL — ABNORMAL LOW (ref 1.0–3.6)
Lymphocyte #: 0.8 10*3/uL — ABNORMAL LOW (ref 1.0–3.6)
MCH: 30.9 pg (ref 26.0–34.0)
MCH: 30.9 pg (ref 26.0–34.0)
MCHC: 33.4 g/dL (ref 32.0–36.0)
MCHC: 33.2 g/dL (ref 32.0–36.0)
MCV: 93 fL (ref 80–100)
MCV: 93 fL (ref 80–100)
MONO ABS: 1.4 x10 3/mm — AB (ref 0.2–1.0)
MONOS PCT: 11.7 %
Monocyte #: 1.1 x10 3/mm — ABNORMAL HIGH (ref 0.2–1.0)
Monocyte %: 13.5 %
NEUTROS ABS: 8.4 10*3/uL — AB (ref 1.4–6.5)
NEUTROS PCT: 81.6 %
Neutrophil #: 7.7 10*3/uL — ABNORMAL HIGH (ref 1.4–6.5)
Neutrophil %: 78.7 %
PLATELETS: 248 10*3/uL (ref 150–440)
Platelet: 270 10*3/uL (ref 150–440)
RBC: 2.58 10*6/uL — AB (ref 4.40–5.90)
RBC: 2.45 10*6/uL — ABNORMAL LOW (ref 4.40–5.90)
RDW: 15.4 % — ABNORMAL HIGH (ref 11.5–14.5)
RDW: 16 % — ABNORMAL HIGH (ref 11.5–14.5)
WBC: 9.5 10*3/uL (ref 3.8–10.6)
WBC: 10.7 10*3/uL — AB (ref 3.8–10.6)

## 2013-11-25 LAB — BASIC METABOLIC PANEL
Anion Gap: 11 (ref 7–16)
Anion Gap: 13 (ref 7–16)
BUN: 92 mg/dL — ABNORMAL HIGH (ref 7–18)
BUN: 101 mg/dL — ABNORMAL HIGH (ref 7–18)
CO2: 27 mmol/L (ref 21–32)
CREATININE: 14 mg/dL — AB (ref 0.60–1.30)
Calcium, Total: 8.4 mg/dL — ABNORMAL LOW (ref 8.5–10.1)
Calcium, Total: 8.5 mg/dL (ref 8.5–10.1)
Chloride: 95 mmol/L — ABNORMAL LOW (ref 98–107)
Chloride: 95 mmol/L — ABNORMAL LOW (ref 98–107)
Co2: 26 mmol/L (ref 21–32)
Creatinine: 13.19 mg/dL — ABNORMAL HIGH (ref 0.60–1.30)
EGFR (African American): 5 — ABNORMAL LOW
EGFR (Non-African Amer.): 4 — ABNORMAL LOW
EGFR (Non-African Amer.): 4 — ABNORMAL LOW
GFR CALC AF AMER: 5 — AB
Glucose: 107 mg/dL — ABNORMAL HIGH (ref 65–99)
Glucose: 88 mg/dL (ref 65–99)
OSMOLALITY: 295 (ref 275–301)
Osmolality: 299 (ref 275–301)
POTASSIUM: 5.2 mmol/L — AB (ref 3.5–5.1)
Potassium: 6.5 mmol/L (ref 3.5–5.1)
Sodium: 133 mmol/L — ABNORMAL LOW (ref 136–145)
Sodium: 134 mmol/L — ABNORMAL LOW (ref 136–145)

## 2013-11-25 LAB — PHENYTOIN LEVEL, TOTAL: DILANTIN: 3.9 ug/mL — AB (ref 10.0–20.0)

## 2013-11-25 LAB — PHOSPHORUS: PHOSPHORUS: 5.2 mg/dL — AB (ref 2.5–4.9)

## 2013-11-26 LAB — BASIC METABOLIC PANEL
Anion Gap: 11 (ref 7–16)
BUN: 48 mg/dL — AB (ref 7–18)
CALCIUM: 8.2 mg/dL — AB (ref 8.5–10.1)
Chloride: 98 mmol/L (ref 98–107)
Co2: 30 mmol/L (ref 21–32)
Creatinine: 7.28 mg/dL — ABNORMAL HIGH (ref 0.60–1.30)
EGFR (African American): 10 — ABNORMAL LOW
EGFR (Non-African Amer.): 8 — ABNORMAL LOW
Glucose: 101 mg/dL — ABNORMAL HIGH (ref 65–99)
Osmolality: 290 (ref 275–301)
Potassium: 4.4 mmol/L (ref 3.5–5.1)
Sodium: 139 mmol/L (ref 136–145)

## 2013-11-26 LAB — ALBUMIN: Albumin: 2.2 g/dL — ABNORMAL LOW (ref 3.4–5.0)

## 2013-11-26 LAB — PHENYTOIN LEVEL, TOTAL: DILANTIN: 3.4 ug/mL — AB (ref 10.0–20.0)

## 2013-11-27 LAB — CBC WITH DIFFERENTIAL/PLATELET
BASOS ABS: 0 10*3/uL (ref 0.0–0.1)
Basophil %: 0.6 %
Eosinophil #: 0.1 10*3/uL (ref 0.0–0.7)
Eosinophil %: 1.2 %
HCT: 23.8 % — ABNORMAL LOW (ref 40.0–52.0)
HGB: 7.5 g/dL — ABNORMAL LOW (ref 13.0–18.0)
Lymphocyte #: 1.3 10*3/uL (ref 1.0–3.6)
Lymphocyte %: 15.1 %
MCH: 29.7 pg (ref 26.0–34.0)
MCHC: 31.7 g/dL — ABNORMAL LOW (ref 32.0–36.0)
MCV: 94 fL (ref 80–100)
MONOS PCT: 15.4 %
Monocyte #: 1.4 x10 3/mm — ABNORMAL HIGH (ref 0.2–1.0)
NEUTROS ABS: 6 10*3/uL (ref 1.4–6.5)
NEUTROS PCT: 67.7 %
Platelet: 202 10*3/uL (ref 150–440)
RBC: 2.54 10*6/uL — AB (ref 4.40–5.90)
RDW: 15.2 % — AB (ref 11.5–14.5)
WBC: 8.9 10*3/uL (ref 3.8–10.6)

## 2013-11-27 LAB — RENAL FUNCTION PANEL
ALBUMIN: 2 g/dL — AB (ref 3.4–5.0)
Anion Gap: 13 (ref 7–16)
BUN: 68 mg/dL — AB (ref 7–18)
CALCIUM: 8.4 mg/dL — AB (ref 8.5–10.1)
Chloride: 98 mmol/L (ref 98–107)
Co2: 29 mmol/L (ref 21–32)
Creatinine: 11.13 mg/dL — ABNORMAL HIGH (ref 0.60–1.30)
EGFR (African American): 6 — ABNORMAL LOW
EGFR (Non-African Amer.): 5 — ABNORMAL LOW
GLUCOSE: 141 mg/dL — AB (ref 65–99)
Osmolality: 302 (ref 275–301)
Phosphorus: 6.3 mg/dL — ABNORMAL HIGH (ref 2.5–4.9)
Potassium: 4.5 mmol/L (ref 3.5–5.1)
SODIUM: 140 mmol/L (ref 136–145)

## 2013-11-28 LAB — CULTURE, BLOOD (SINGLE)

## 2013-11-29 LAB — VANCOMYCIN, TROUGH: Vancomycin, Trough: 24 ug/mL (ref 10–20)

## 2013-11-29 LAB — BASIC METABOLIC PANEL
Anion Gap: 10 (ref 7–16)
BUN: 52 mg/dL — AB (ref 7–18)
CHLORIDE: 100 mmol/L (ref 98–107)
Calcium, Total: 8.1 mg/dL — ABNORMAL LOW (ref 8.5–10.1)
Co2: 30 mmol/L (ref 21–32)
Creatinine: 9.24 mg/dL — ABNORMAL HIGH (ref 0.60–1.30)
EGFR (Non-African Amer.): 6 — ABNORMAL LOW
GFR CALC AF AMER: 8 — AB
GLUCOSE: 130 mg/dL — AB (ref 65–99)
OSMOLALITY: 295 (ref 275–301)
Potassium: 4.3 mmol/L (ref 3.5–5.1)
SODIUM: 140 mmol/L (ref 136–145)

## 2013-11-29 LAB — CBC WITH DIFFERENTIAL/PLATELET
BASOS ABS: 0 10*3/uL (ref 0.0–0.1)
Basophil %: 0.5 %
EOS ABS: 0.2 10*3/uL (ref 0.0–0.7)
Eosinophil %: 2.9 %
HCT: 21.5 % — ABNORMAL LOW (ref 40.0–52.0)
HGB: 7.1 g/dL — AB (ref 13.0–18.0)
Lymphocyte #: 1.5 10*3/uL (ref 1.0–3.6)
Lymphocyte %: 20.3 %
MCH: 30.4 pg (ref 26.0–34.0)
MCHC: 32.9 g/dL (ref 32.0–36.0)
MCV: 92 fL (ref 80–100)
MONOS PCT: 9.7 %
Monocyte #: 0.7 x10 3/mm (ref 0.2–1.0)
NEUTROS ABS: 4.8 10*3/uL (ref 1.4–6.5)
Neutrophil %: 66.6 %
PLATELETS: 220 10*3/uL (ref 150–440)
RBC: 2.32 10*6/uL — ABNORMAL LOW (ref 4.40–5.90)
RDW: 15.5 % — ABNORMAL HIGH (ref 11.5–14.5)
WBC: 7.2 10*3/uL (ref 3.8–10.6)

## 2013-11-29 LAB — PHOSPHORUS: Phosphorus: 4.8 mg/dL (ref 2.5–4.9)

## 2013-11-29 LAB — ALBUMIN: ALBUMIN: 1.9 g/dL — AB (ref 3.4–5.0)

## 2013-11-29 LAB — WOUND AEROBIC CULTURE

## 2013-11-29 LAB — PHENYTOIN LEVEL, TOTAL: DILANTIN: 4.1 ug/mL — AB (ref 10.0–20.0)

## 2013-11-30 LAB — CULTURE, BLOOD (SINGLE)

## 2013-12-01 LAB — AEROBIC CULTURE

## 2013-12-02 LAB — PHOSPHORUS: PHOSPHORUS: 5.8 mg/dL — AB (ref 2.5–4.9)

## 2013-12-02 LAB — CBC WITH DIFFERENTIAL/PLATELET
BASOS ABS: 0 10*3/uL (ref 0.0–0.1)
Basophil %: 0.5 %
Eosinophil #: 0.3 10*3/uL (ref 0.0–0.7)
Eosinophil %: 3.8 %
HCT: 21.2 % — ABNORMAL LOW (ref 40.0–52.0)
HGB: 6.7 g/dL — AB (ref 13.0–18.0)
LYMPHS ABS: 1.7 10*3/uL (ref 1.0–3.6)
LYMPHS PCT: 26.1 %
MCH: 29.3 pg (ref 26.0–34.0)
MCHC: 31.6 g/dL — AB (ref 32.0–36.0)
MCV: 93 fL (ref 80–100)
MONO ABS: 0.6 x10 3/mm (ref 0.2–1.0)
Monocyte %: 9.7 %
NEUTROS PCT: 59.9 %
Neutrophil #: 3.9 10*3/uL (ref 1.4–6.5)
PLATELETS: 327 10*3/uL (ref 150–440)
RBC: 2.28 10*6/uL — ABNORMAL LOW (ref 4.40–5.90)
RDW: 15.8 % — ABNORMAL HIGH (ref 11.5–14.5)
WBC: 6.6 10*3/uL (ref 3.8–10.6)

## 2013-12-02 LAB — RENAL FUNCTION PANEL
ALBUMIN: 2.1 g/dL — AB (ref 3.4–5.0)
Anion Gap: 9 (ref 7–16)
BUN: 58 mg/dL — AB (ref 7–18)
CALCIUM: 8.4 mg/dL — AB (ref 8.5–10.1)
CHLORIDE: 102 mmol/L (ref 98–107)
CO2: 31 mmol/L (ref 21–32)
CREATININE: 10.15 mg/dL — AB (ref 0.60–1.30)
EGFR (Non-African Amer.): 6 — ABNORMAL LOW
GFR CALC AF AMER: 7 — AB
GLUCOSE: 132 mg/dL — AB (ref 65–99)
OSMOLALITY: 301 (ref 275–301)
POTASSIUM: 4.4 mmol/L (ref 3.5–5.1)
SODIUM: 142 mmol/L (ref 136–145)

## 2013-12-03 LAB — CULTURE, BLOOD (SINGLE)

## 2013-12-03 LAB — HEMOGLOBIN: HGB: 7.4 g/dL — AB (ref 13.0–18.0)

## 2013-12-04 LAB — PHOSPHORUS: Phosphorus: 5.6 mg/dL — ABNORMAL HIGH (ref 2.5–4.9)

## 2013-12-06 LAB — RENAL FUNCTION PANEL
ALBUMIN: 2 g/dL — AB (ref 3.4–5.0)
Anion Gap: 6 — ABNORMAL LOW (ref 7–16)
BUN: 44 mg/dL — ABNORMAL HIGH (ref 7–18)
CHLORIDE: 102 mmol/L (ref 98–107)
Calcium, Total: 8.1 mg/dL — ABNORMAL LOW (ref 8.5–10.1)
Co2: 31 mmol/L (ref 21–32)
Creatinine: 8.57 mg/dL — ABNORMAL HIGH (ref 0.60–1.30)
EGFR (African American): 8 — ABNORMAL LOW
GFR CALC NON AF AMER: 7 — AB
Glucose: 84 mg/dL (ref 65–99)
Osmolality: 288 (ref 275–301)
Phosphorus: 4.5 mg/dL (ref 2.5–4.9)
Potassium: 4.6 mmol/L (ref 3.5–5.1)
SODIUM: 139 mmol/L (ref 136–145)

## 2013-12-06 LAB — CBC WITH DIFFERENTIAL/PLATELET
BASOS ABS: 0 10*3/uL (ref 0.0–0.1)
BASOS PCT: 0.4 %
Eosinophil #: 0.2 10*3/uL (ref 0.0–0.7)
Eosinophil %: 3.1 %
HCT: 23.9 % — ABNORMAL LOW (ref 40.0–52.0)
HGB: 7.8 g/dL — AB (ref 13.0–18.0)
LYMPHS ABS: 1.3 10*3/uL (ref 1.0–3.6)
LYMPHS PCT: 16.1 %
MCH: 29.8 pg (ref 26.0–34.0)
MCHC: 32.4 g/dL (ref 32.0–36.0)
MCV: 92 fL (ref 80–100)
Monocyte #: 0.8 x10 3/mm (ref 0.2–1.0)
Monocyte %: 10.5 %
Neutrophil #: 5.5 10*3/uL (ref 1.4–6.5)
Neutrophil %: 69.9 %
PLATELETS: 364 10*3/uL (ref 150–440)
RBC: 2.6 10*6/uL — AB (ref 4.40–5.90)
RDW: 15 % — ABNORMAL HIGH (ref 11.5–14.5)
WBC: 7.8 10*3/uL (ref 3.8–10.6)

## 2013-12-06 LAB — VANCOMYCIN, TROUGH: VANCOMYCIN, TROUGH: 25 ug/mL — AB (ref 10–20)

## 2013-12-08 LAB — CBC WITH DIFFERENTIAL/PLATELET
Basophil #: 0 10*3/uL (ref 0.0–0.1)
Basophil %: 0.8 %
EOS ABS: 0.2 10*3/uL (ref 0.0–0.7)
Eosinophil %: 3.4 %
HCT: 24 % — AB (ref 40.0–52.0)
HGB: 8 g/dL — ABNORMAL LOW (ref 13.0–18.0)
Lymphocyte #: 1.9 10*3/uL (ref 1.0–3.6)
Lymphocyte %: 28.7 %
MCH: 30.5 pg (ref 26.0–34.0)
MCHC: 33.1 g/dL (ref 32.0–36.0)
MCV: 92 fL (ref 80–100)
Monocyte #: 0.7 x10 3/mm (ref 0.2–1.0)
Monocyte %: 10.6 %
Neutrophil #: 3.7 10*3/uL (ref 1.4–6.5)
Neutrophil %: 56.5 %
Platelet: 343 10*3/uL (ref 150–440)
RBC: 2.61 10*6/uL — ABNORMAL LOW (ref 4.40–5.90)
RDW: 15.5 % — ABNORMAL HIGH (ref 11.5–14.5)
WBC: 6.5 10*3/uL (ref 3.8–10.6)

## 2013-12-08 LAB — BASIC METABOLIC PANEL
Anion Gap: 6 — ABNORMAL LOW (ref 7–16)
BUN: 46 mg/dL — ABNORMAL HIGH (ref 7–18)
CALCIUM: 8.1 mg/dL — AB (ref 8.5–10.1)
CHLORIDE: 102 mmol/L (ref 98–107)
CO2: 31 mmol/L (ref 21–32)
CREATININE: 8.4 mg/dL — AB (ref 0.60–1.30)
EGFR (African American): 8 — ABNORMAL LOW
GFR CALC NON AF AMER: 7 — AB
Glucose: 92 mg/dL (ref 65–99)
OSMOLALITY: 289 (ref 275–301)
Potassium: 5.1 mmol/L (ref 3.5–5.1)
Sodium: 139 mmol/L (ref 136–145)

## 2013-12-09 LAB — CBC WITH DIFFERENTIAL/PLATELET
BASOS ABS: 0 10*3/uL (ref 0.0–0.1)
BASOS PCT: 0.5 %
Eosinophil #: 0.2 10*3/uL (ref 0.0–0.7)
Eosinophil %: 2.8 %
HCT: 24.1 % — ABNORMAL LOW (ref 40.0–52.0)
HGB: 7.8 g/dL — AB (ref 13.0–18.0)
LYMPHS PCT: 20.1 %
Lymphocyte #: 1.5 10*3/uL (ref 1.0–3.6)
MCH: 29.7 pg (ref 26.0–34.0)
MCHC: 32.4 g/dL (ref 32.0–36.0)
MCV: 92 fL (ref 80–100)
MONO ABS: 0.7 x10 3/mm (ref 0.2–1.0)
MONOS PCT: 9.9 %
Neutrophil #: 5 10*3/uL (ref 1.4–6.5)
Neutrophil %: 66.7 %
PLATELETS: 375 10*3/uL (ref 150–440)
RBC: 2.64 10*6/uL — AB (ref 4.40–5.90)
RDW: 15.4 % — ABNORMAL HIGH (ref 11.5–14.5)
WBC: 7.4 10*3/uL (ref 3.8–10.6)

## 2013-12-09 LAB — RENAL FUNCTION PANEL
ALBUMIN: 2.2 g/dL — AB (ref 3.4–5.0)
Anion Gap: 8 (ref 7–16)
BUN: 60 mg/dL — ABNORMAL HIGH (ref 7–18)
CALCIUM: 8.2 mg/dL — AB (ref 8.5–10.1)
CHLORIDE: 100 mmol/L (ref 98–107)
Co2: 29 mmol/L (ref 21–32)
Creatinine: 10.74 mg/dL — ABNORMAL HIGH (ref 0.60–1.30)
EGFR (African American): 6 — ABNORMAL LOW
EGFR (Non-African Amer.): 5 — ABNORMAL LOW
GLUCOSE: 97 mg/dL (ref 65–99)
Osmolality: 291 (ref 275–301)
Phosphorus: 5.1 mg/dL — ABNORMAL HIGH (ref 2.5–4.9)
Potassium: 5.6 mmol/L — ABNORMAL HIGH (ref 3.5–5.1)
Sodium: 137 mmol/L (ref 136–145)

## 2013-12-10 LAB — PHOSPHORUS: PHOSPHORUS: 4.5 mg/dL (ref 2.5–4.9)

## 2013-12-10 LAB — HEMOGLOBIN: HGB: 8.2 g/dL — ABNORMAL LOW (ref 13.0–18.0)

## 2014-04-27 ENCOUNTER — Ambulatory Visit
Admit: 2014-04-27 | Disposition: A | Discharge status: Home / Self Care | Payer: Self-pay | Attending: Vascular Surgery | Admitting: Vascular Surgery

## 2014-05-06 ENCOUNTER — Ambulatory Visit
Admit: 2014-05-06 | Disposition: A | Discharge status: Home / Self Care | Payer: Self-pay | Attending: Vascular Surgery | Admitting: Vascular Surgery

## 2014-05-09 NOTE — H&P (Signed)
PATIENT NAME:  [name], FLAVELL MR#:  [id number] DATE OF BIRTH:  1955-12-08  DATE OF ADMISSION:  11/06/2013  REFERRING PHYSICIAN:  Darien Ramus, MD  PRIMARY CARE PHYSICIAN:  Nonlocal.   ADMISSION DIAGNOSIS:  Cerebrovascular accident.   HISTORY OF PRESENT ILLNESS:  This is a 59 year old African-American male who presents to the Emergency Department via EMS after his assisted living facility staff called for acute mental status change. They report the patient was very confused and that he had fallen out of his wheelchair multiple times. There was concern that he had sustained a laceration above his eye and potentially hit his head so hard that he may have a closed head injury. In the Emergency Department, CT scan revealed a subacute infarct in his parietal lobe. He was also found to be hypoglycemic at a blood sugar of 58. In the Emergency Department, the patient's mental status appeared to have returned to baseline, which admittedly is slightly egocentric but certainly not concerning. Due to his head imaging, the Emergency Department staff called for admission.   REVIEW OF SYSTEMS: CONSTITUTIONAL:  The patient denies fever but admits to generalized weakness.  EYES:  Denies blurred vision or inflammation.  EARS, NOSE, AND THROAT:  Denies tinnitus or difficulty swallowing.  RESPIRATORY:  Denies cough or shortness of breath.  CARDIOVASCULAR:  Denies chest pain or palpitations.  GASTROINTESTINAL:  The patient denies nausea, vomiting, diarrhea, or abdominal pain.  GENITOURINARY:  Denies dysuria, increased frequency, or hesitancy.  ENDOCRINE:  Denies polyuria or polydipsia.  HEMATOLOGIC AND LYMPHATIC:  Denies easy bruising but admits to easy bleeding and anemia, as he is a dialysis patient.  INTEGUMENTARY:  Denies rashes, but he is concerned about the appearance of his left lower extremity.  MUSCULOSKELETAL:  Denies myalgias but admits to arthralgias.  NEUROLOGIC:  Denies numbness in his extremities  or dysarthria. He states that the sound of his voice is how he always talks since he has broken his upper dentures.  PSYCHIATRIC:  The patient denies depression or suicidal ideation.   PAST MEDICAL HISTORY:  Coronary artery disease, end-stage renal disease, hypertension, secondary hyperparathyroidism, hepatitis C, diabetes mellitus type 2, history of CVAs as well as a history of prostate cancer.   PAST SURGICAL HISTORY:  Prostatectomy, AV fistula placement x 3, and surgery on the left shoulder.   SOCIAL HISTORY:  The patient denies tobacco, alcohol, or drug abuse. He was previously incarcerated.   FAMILY HISTORY:  Significant for hypertension.   MEDICATIONS: 1.  Bactrim double strength 800 mg/160 mg oral tablet 1 tab p.o. b.i.d.  2.  Bisacodyl 5 mg delayed-release tablets 2 tabs p.o. daily as needed for constipation.  3.  Clonidine 0.1 mg 1 tab p.o. b.i.d.  4.  Clopidogrel 75 mg 1 tab p.o. daily.  5.  Diphenhydramine 50 mg capsule 1 capsule p.o. t.i.d. as needed for allergies.  6.  Fluconazole 100 mg 1 tablet p.o. daily x 10 days.  7.  Furosemide 40 mg 1 tab p.o. daily.  8.  Gabapentin 300 mg capsule 1 capsule p.o. b.i.d.  9.  Humulin 70/30, 15 units subcutaneously 2 times per day before meals.   10.  Ketoconazole 2% topical cream applied to affected area at bedtime.  11.  Lisinopril 20 mg 1 tab p.o. daily.  12.  Minoxidil 2.5 mg 2 tabs p.o. daily.  13.  Nephro-Vite vitamin B complex with C and folic acid tablets 1 tab p.o. daily.  14.  Nortriptyline 50 mg capsule 1 capsule p.o.  daily at bedtime.  15.  Nystatin 100,000 units/mL oral suspension 5 mL p.o. every 6 hours x 10 days.  16.  Oxycodone 5 mg 1 to 2 tablets p.o. every 6 hours as needed for pain.  17.  Pantoprazole 40 mg extended-release tablet 1 tab p.o. b.i.d.  18.  Phenytoin 300 mg extended-release capsule 1 capsule p.o. daily at bedtime.  19.  Renvela (sevelamer carbonate) 800 mg 4 tablets p.o. t.i.d.  20.  Sensipar 60 mg 1  tab p.o. daily.  21.  Sucralfate 1 gram per 10 mL suspension 10 mL p.o. 4 times a day before meals and at bedtime.  22.  Terbinafine topical cream 1% applied topically to the affected area 2 times per day.   ALLERGIES:  HYDRALAZINE, HYDROCODONE, NAPROXEN, AND PENICILLIN.   PERTINENT LABORATORY RESULTS AND RADIOGRAPHIC FINDINGS:  Serum glucose is 104, BUN 40, creatinine 10.98, sodium 140, potassium 4.6, chloride 110, CO2 is 20, calcium is 8, serum albumin 2.8, alkaline phosphatase 152, AST 29, and ALT is 65. Troponin is 0.04. Dilantin level is 26.7. White blood cell count is 8.9, hemoglobin 9.1, hematocrit 28.7, platelet count 513.   CT of the head shows a 4 cm infarct in the right parietal lobe of indeterminate age. There is no hemorrhage, and there is no volume loss. Also, there are bilateral cerebellar infarcts, the right greater than the left, both of which are remote. There is also a supraorbital scalp soft tissue swelling without underlying fracture, and there is no acute intracranial traumatic injury.   PHYSICAL EXAMINATION:  VITAL SIGNS:  This evening, temperature is 97.9, pulse 91, respirations 20, blood pressure 161/82, pulse oximetry 100% on room air.  GENERAL:  The patient is alert and oriented x 3 and in no apparent distress.  HEENT:  Normocephalic. Pupils are equal, round, and reactive to light and accommodation. Extraocular movements are intact. There is a 1.5 cm laceration to the right eyebrow, which is well approximated and only minimally swollen at this time. Mucous membranes are moist. The patient does not have any upper teeth and has a few partially decayed teeth on the bottom.  NECK:  Trachea is midline. No adenopathy.  CHEST:  Symmetric, atraumatic. There is an old area of access over the right subclavian, which is clean and dressed.  CARDIOVASCULAR:  Regular rate and rhythm. Normal S1, S2. No rubs, clicks, or murmurs appreciated.  LUNGS:  Clear to auscultation bilaterally.  Normal effort and excursion.  ABDOMEN:  Positive bowel sounds. Soft, nontender. There is no hepatosplenomegaly. The abdomen does appear to be slightly distended, and there are multiple well-healed scars from previous abdominal surgeries.  GENITOURINARY:  Deferred.  MUSCULOSKELETAL:  The patient can move all 4 extremities but is definitely more active with his upper extremities than lower extremities. The patient does not fully cooperate with strength testing.  SKIN:  The patient has no rashes visible at this time; however, he does have some darkening of the skin over the left ankle, which appears to be perhaps the beginnings of cellulitis or could potentially represent decreased blood flow to that area of his extremity. I do not see any ashen areas that might indicate dermatophyte infection at this time.  NEUROLOGIC:  Cranial nerves II through XII are grossly intact. The patient does have 1+ reflexes in his patella tendons.  PSYCHIATRIC:  Mood is normal. Affect is congruent.   ASSESSMENT AND PLAN:  This is a 59 year old African-American male admitted for cerebrovascular accident and hypoglycemia.   1.  Cerebrovascular accident appears to be a subacute infarct of the right parietal lobe. It is unclear if the patient's symptoms tonight reflect any progression of this particular area of ischemic infarct. It seems that he certainly fell out of his chair and may or may not have received proper assistance in getting back into his chair. Furthermore, the patient does have a very difficult to understand voice not only due to his lack of teeth at this time, but also due to his syntax. It does not appear that he has any dysphagia by the nursing swallow evaluation nor does he have any acute weakness. Nonetheless, I have ordered an ultrasound of his carotids bilaterally as well as MRI of the brain. We will continue Plavix now for secondary prevention of stroke and coronary artery disease.  2.  Hypoglycemia. This  certainly may have contributed to the mental status the staff at his facility witnessed. Again, this may unfortunately have been somewhat voluntary, as most of the patient's complaints here in the hospital were about the quality and healthiness of the food he was eating at the nursing facility. Here, he would like to eat, and I will encourage him to do so by mouth. I have ordered a formal swallow evaluation so that we can determine the best consistency of his diet going forward.  3.  End-stage renal disease. We will continue dialysis per the patient's regular schedule.  4. Hyperparathyroidism secondary to his end-stage renal disease. We will continue Renvela, Sensipar, and his Nephrocaps vitamins.  5.  Hypertension. We will continue lisinopril, minoxidil, and clonidine.  6. Diabetes. I have initiated low-dose sliding scale insulin for now. We will start the patient back on his basal insulin either in mixed form or in addition to prandial insulin prior to discharge from the hospital.  7.  Seizure disorder. We will continue Dilantin.  8.  Hepatitis C, stable. The patient does not appear to be in treatment at this time.  9.  Deep vein thrombosis prophylaxis. Heparin.  10.  Gastrointestinal prophylaxis. Continue pantoprazole per the patient's home regimen.   CODE STATUS:  The patient is a full code.   TIME SPENT ON ADMISSION ORDERS AND PATIENT CARE:  Approximately 45 minutes.    ____________________________ Kelton Pillar. Sheryle Hail, MD msd:nb D: 11/06/2013 01:52:35 ET T: 11/06/2013 02:25:23 ET JOB#: [id number]  cc: Kelton Pillar. Sheryle Hail, MD, <Dictator> Kelton Pillar Lizabeth Fellner MD ELECTRONICALLY SIGNED 11/07/2013 7:37

## 2014-05-09 NOTE — Consult Note (Signed)
CHIEF COMPLAINT and HISTORY:  Subjective/Chief Complaint N/V, constipation   History of Present Illness Patient is a 59 yo AAM with longstanding ESRD.  he is admitted with severe constipation and N/V as well as needing HD.  He is seen on HD today. Awakens easily and seems to be a reasonably historian.  Has had a right arm forearm graft which he says has been present for about 5 years.  This has two large aneurysmal areas from sticking the same areas frequently.  They are not tender or warm.  have ben present for some time and he can not say how long.  There are slight skin separations with scabs present at both the arterial and venous access sites.  We have stuck away from these areas today and his graft seems to be working well.  No fever or chills or signs of systemic infection.   Past History ESRD HTN Hepatitis C H/o drug use   ALLERGIES:  Allergies:  Penicillin: Other  Naproxen: Other  Hydralazine: Other  Hydrocodone Bitartrate: Other  Family and Social History:  Family History Non-Contributory   Social History positive tobacco (Greater than 1 year), negative ETOH, positive Illicit drugs, previously, denies currently   + Tobacco Prior (greater than 1 year)   Place of Living Home   Review of Systems:  Subjective/Chief Complaint No TIA/stroke/seizure No heat or cold intolerance No dysuria/hematuria No blurry or double vision No tinnitus or ear pain No rashes or ulcer   Fever/Chills No   Cough No   Sputum No   Abdominal Pain Yes   Diarrhea No   Constipation Yes   Nausea/Vomiting Yes   SOB/DOE No   Chest Pain No   Telemetry Reviewed NSR   Dysuria ESRD   Tolerating PT Yes   Tolerating Diet No  Nauseated   Medications/Allergies Reviewed Medications/Allergies reviewed   Physical Exam:  GEN well developed, well nourished, no acute distress, disheveled   HEENT pink conjunctivae, hearing intact to voice   NECK No masses  trachea midline   RESP normal  resp effort  no use of accessory muscles   CARD regular rate  no JVD   VASCULAR ACCESS AV graft present  Good bruit  Good thrill  right arm AVG, as above   ABD denies tenderness  soft  normal BS   GU no superpubic tenderness   LYMPH negative neck, negative axillae   EXTR negative cyanosis/clubbing, positive edema   SKIN normal to palpation, skin turgor good   NEURO cranial nerves intact, follows commands, motor/sensory function intact   PSYCH alert, A+O to time, place, person   LABS:  Laboratory Results: Hepatic:    01-Oct-15 06:52, Comprehensive Metabolic Panel  Bilirubin, Total 1.0  Alkaline Phosphatase 159  46-116  NOTE: New Reference Range  08/05/13  SGPT (ALT) 33  14-63  NOTE: New Reference Range  08/05/13  SGOT (AST) 24  Total Protein, Serum 8.8  Albumin, Serum 3.9  TDMs:    01-Oct-15 06:52, Dilantin, Serum  Dilantin, Serum 15.9  Result(s) reported on 16 Oct 2013 at 10:49AM.  Routine Chem:    01-Oct-15 06:52, Comprehensive Metabolic Panel  Glucose, Serum 169  BUN 84  Creatinine (comp) 14.97  Sodium, Serum 138  Potassium, Serum 4.6  Chloride, Serum 90  CO2, Serum 39  Calcium (Total), Serum 9.2  Osmolality (calc) 305  eGFR (African American) 4  eGFR (Non-African American) 4  eGFR values <58m/min/1.73 m2 may be an indication of chronic  kidney disease (CKD).  Calculated eGFR, using the MRDR Study equation, is useful in   patients with stable renal function.  The eGFR calculation will not be reliable in acutely ill patients  when serum creatinine is changing rapidly. It is not useful in  patients on dialysis. The eGFR calculation may not be applicable  to patients at the low and high extremes of body sizes, pregnant  women, and vetetarians.  Anion Gap 9    01-Oct-15 06:52, Lipase  Lipase 568  Result(s) reported on 16 Oct 2013 at 07:52AM.    01-Oct-15 06:52, Phosphorus, Serum  Phosphorus, Serum 4.0  Result(s) reported on 16 Oct 2013 at 01:28PM.     01-Oct-15 06:52, Troponin I  Result Comment   TROPONIN - RESULTS VERIFIED BY REPEAT TESTING.   - CALLED TO ALLIE RILEY,RN 10/16/13 _0    - .Marland KitchenMarland KitchenSRB   - READ-BACK PROCESS PERFORMED.   Result(s) reported on 16 Oct 2013 at 08:17AM.  Cardiac:  Troponin I 0.07  0.00-0.05  0.05 ng/mL or less: NEGATIVE   Repeat testing in 3-6 hrs   if clinically indicated.  >0.05 ng/mL: POTENTIAL   MYOCARDIAL INJURY. Repeat   testing in 3-6 hrs if   clinically indicated.  NOTE: An increase or decrease   of 30% or more on serial   testing suggests a   clinically important change  Routine Hem:    01-Oct-15 06:52, CBC Profile  WBC (CBC) 10.9  RBC (CBC) 4.57  Hemoglobin (CBC) 13.6  Hematocrit (CBC) 42.0  Platelet Count (CBC) 265  MCV 92  MCH 29.9  MCHC 32.5  RDW 14.6  Neutrophil % 81.1  Lymphocyte % 11.6  Monocyte % 6.1  Eosinophil % 0.9  Basophil % 0.3  Neutrophil # 8.9  Lymphocyte # 1.3  Monocyte # 0.7  Eosinophil # 0.1  Basophil # 0.0  Result(s) reported on 16 Oct 2013 at 07:16AM.   RADIOLOGY:  Radiology Results: XRay:    01-Oct-15 07:53, Abdomen 3 Way Includes PA Chest  Abdomen 3 Way Includes PA Chest  REASON FOR EXAM:    vomiting, r/o obstruction  COMMENTS:       PROCEDURE: DXR - DXR ABDOMEN 3-WAY (INCL PA CXR)  - Oct 16 2013  7:53AM     CLINICAL DATA:  Abdominal pain with nausea and vomiting since 6 p.m.  last night. Several episodes of hematemesis; constipation for 1 week  the patient has dialysis dependent renal failure, history of  prostate cancer    EXAM:  ABDOMEN SERIES    COMPARISON:  None.  FINDINGS:  The lungs are well-expanded and clear. The cardiopericardial  silhouette is enlarged.The pulmonary vascularity is not engorged.  The patient has undergone previous median sternotomy. There is no  pleural effusion.    Within the abdomen there is increased stool burden throughout the  colon. There is no evidence of a small or large bowel obstruction  nor evidence  of perforation. There are numerous surgical clips  within the pelvis. A penile prosthesis reservoir were is present  over the right aspect of the pelvis. No acute bony abnormalities are  demonstrated.     IMPRESSION:  1. There is no acute cardiopulmonary abnormality.  2. Increased stool burden throughout the colon is consistent with  constipation. There is no evidence of bowel obstruction.      Electronically Signed    By: David  Martinique    On: 10/16/2013 07:58         Verified By: DAVID A. 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  The skin overlying these have scabs and is clearly threatened.  They do not appear actively infected and he has no signs of sepsis from the graft.   Have discussed the situation with the patient.  This graft will need to be revised to keep it functional long term and to avoid bleeding from the aneurysmal sites.  Discussed that would usually do a jump graft around the aneurysmal areas with a new graft or an Artegraft.  Would plan a Permcath to be placed prior to surgical revision to use for 2-3 weeks while the jump graft heals.  Discussed risks and benefits and he is agreeable to proceeding.   permcath is scheduled for Monday Will schedule the revision as an outpatient for later next week or the following week.   level 4 consult   Electronic Signatures: Algernon Huxley (MD)  (Signed 01-Oct-15 15:52)  Authored: Chief Complaint and History, ALLERGIES, Family and Social History, Review of Systems, Physical Exam, LABS, RADIOLOGY, Assessment and Plan   Last Updated: 01-Oct-15 15:52 by Algernon Huxley (MD)

## 2014-05-09 NOTE — Consult Note (Signed)
Pt with multiple ulcers in antrum, severe reflux esophagitis with possible candida component.  Stop all non essential oral meds for now. I have done this.  Carafate slurry to continue.  Give Reglan iv q6hrs and increase dose to 10mg .  i will write.  If any obvious side effectts will decrease to 5mg .  Will need iv pain meds more frequently due to severe ulcerations. clear liquid diet and advance to Full liquid diet. Will follow.  Electronic Signatures: Scot JunElliott, Karel Mowers T (MD)  (Signed on 07-Oct-15 15:28)  Authored  Last Updated: 07-Oct-15 15:28 by Scot JunElliott, Parthiv Mucci T (MD)

## 2014-05-09 NOTE — H&P (Signed)
PATIENT NAME:  [name], [name] DATE OF BIRTH:  [birth year]  DATE OF ADMISSION:  10/16/2013  PRIMARY CARE PROVIDER: None.  REASON FOR ADMISSION: Needing hemodialysis as well as severe abdominal epigastric pain.   HISTORY OF PRESENT ILLNESS: The patient is a 59 year old African American male who currently resides in a group home, who was started on hemodialysis since 2009.  He has been having abdominal pain for the past 1 week with or worsening distention.  The patient also was supposed to go to dialysis which he missed a few days ago due to not feeling well.  The patient came to the ED with complaint of worsening abdominal pain and also complaining of bilateral lower extremity pain.  He was also nauseous and throwing up.  He has intermittent shortness of breath, which is chronic. Denies any fevers or chills.  Also complains of intermittent chest pain. Denies any urinary symptoms.   PAST MEDICAL HISTORY: Significant for: 1.  Coronary artery disease.  2.  End-stage renal disease.  3.  Hypertension.  4.  Secondary hyperparathyroidism.  5.  Hepatitis C.  6.  Diabetes.  7.  History of prostate cancer status post prostatectomy.  8.  History of CVA, usually he is in a wheelchair.  He reports that he is not able to walk.   PAST SURGICAL HISTORY: Status post fistula x 3, history of left shoulder surgery, prostatectomy.  ALLERGIES:  HYDRALAZINE, HYDROCODONE, NAPROXEN, PENICILLIN.   SOCIAL HISTORY: Does not smoke. Does not drink. No drugs. Previously was incarcerated for selling drugs.   FAMILY HISTORY: Positive for hypertension.   MEDICATIONS: Tramadol 50 at 1 tab p.o. every 12, terbinafine topically 1% applied topically to affected area b.i.d., Sensipar 60 daily,  Renvela 800 at 4 tabs t.i.d., phenytoin 200 mg t.i.d. Omega-3 at 1 tab p.o. b.i.d., nortriptyline 50 at bedtime, (Dictation Anomaly)  1 tab p.o. daily, Mucinex 2 tabs every 12, minoxidil 5 mg daily, lisinopril 20 daily,  lidocaine with (Dictation Anomaly) applied topically to affected areas Monday, Wednesday and Friday prior to dialysis, lactase 3000 units 2 tabs 4 times a day, ketoconazole topically applied to affected area at bedtime, 70/30 insulin 50 units b.i.d., gabapentin 300 mg 1 tab p.o. b.i.d., Lasix 40 daily, Colace 100 mg 1 tab p.o. b.i.d., diphenhydramine 50 at 1 tab p.o. t.i.d.,  Plavix 75 p.o. daily, clonidine 0.2 mg 1 tab p.o. every 8 hours on Tuesday, Thursday, Saturday, clonidine 0.2 mg 1 tab p.o. Monday, Wednesday, Friday, Bisacodyl 5 mg 2 tabs at bedtime, aspirin 81 mg 1 tab p.o. daily, acetaminophen 650, 2 tabs b.i.d.    REVIEW OF SYSTEMS:  CONSTITUTIONAL: Denies any fevers. Complains of fatigue, weakness, abdominal pain. No weight loss or weight gain.  EYES: No blurred or double vision. No pain. No redness. No inflammation.  ENT: No tinnitus. No ear pain. No hearing loss. No seasonal or year-round allergies. No epistaxis.  RESPIRATORY: Denies any cough, wheezing, hemoptysis.  CARDIOVASCULAR: Complains of intermittent chest pain, orthopnea, edema, or arrhythmia.  GASTROINTESTINAL: Complains of nausea, vomiting, abdominal pain. No hematemesis. No melena. No ulcers. No IBS. No jaundice.  GENITOURINARY: Denies dysuria, hematuria, renal calc or frequency.  ENDOCRINE: Denies any polyuria, nocturia, thyroid problems.  HEMATOLOGIC:  Denies anemia, easy bruisability or bleeding.  SKIN: No acne. No rash.  MUSCULOSKELETAL: Denies any pain in the neck, back or shoulder.  NEUROLOGICAL: Has a history of CVA, and history of seizures.  PSYCHIATRIC: Has depression.   PHYSICAL EXAMINATION:  VITAL SIGNS:  Temperature 98.3, pulse 99, respirations 26, blood pressure 186/100.  GENERAL: The patient is a well-developed male in no acute distress.  HEENT: Head atraumatic, normocephalic.  Pupils equally round, reactive to light and accommodation. There is no conjunctival pallor. No scleral icterus. Nasal exam shows no  drainage or ulceration.  OROPHARYNX: Clear, no exudate.  External ear exam shows no drainage or erythema. Nasal exam shows no drainage or ulceration.  NECK:  Supple without JVD. No thyromegaly.  CARDIOVASCULAR: Regular rate and rhythm. No murmurs, rubs, clicks, or gallops.  LUNGS: Clear to auscultation bilaterally without any rales, rhonchi, wheezing.  ABDOMEN: Distended, positive bowel sounds x 4 and there is some tenderness. No guarding. No rebound.  EXTREMITIES: No clubbing, cyanosis, or edema.  SKIN: No rash.  LYMPHATICS: No lymph nodes palpable.  VASCULAR: Good DP, PT pulses.  PSYCHIATRIC: Not anxious or depressed.  NEUROLOGIC: The patient is awake, alert cranial nerves II through XII grossly appear intact. He has generalized weakness,   LABORATORY DATA:  Abdominal 3-way x-ray shows no acute cardiopulmonary processes. Increased stool blood throughout the colon consistent with constipation.  Glucose 169, BUN 84, creatinine 14.59, sodium 138, potassium 4.6, chloride 90, CO2 is 39.  Lipase 568.  LFTs: Total protein 8.8, albumin 3.9, bilirubin total 1.0, alkaline phosphatase 159, AST 24, ALT 33. Troponin 0.07. WBC 10.9, hemoglobin 13.6, platelet count 265,000.   ASSESSMENT AND PLAN: The patient is a 59 year old white male with end-stage renal disease, hypertension, diabetes, who presents with severe epigastric pain, was noticed to have severe constipation.  1.  Severe abdominal pain due to severe constipation.  At this time, we will go ahead and put him on soapsuds enema every 8 hours, start him on lactulose.  We will also start him on Colace and senna.  If no improvement, we will ask Gastrointestinal to evaluate the patient.  2.  End-stage renal disease, hemodialysis today and then per nephrology.  3.  Hypertension. We will continue clonidine, minoxidil and ACE inhibitor.  I will also start him on a beta blocker.  4.  Diabetes. Sliding scale insulin.  5.  History of seizure on high-dose  Dilantin. Will check Dilantin level and decrease the dosage to 300 at bedtime.  6.  Miscellaneous. The patient will be on heparin for deep vein thrombosis prophylaxis.    TIME SPENT: 60 minutes. Case discussed with Dr. Thedore Mins of nephrology.      ____________________________ Lacie Scotts Allena Katz, MD shp:DT D: 10/16/2013 10:30:11 ET T: 10/16/2013 10:56:59 ET JOB#: [id number]  cc: Borna Wessinger H. Allena Katz, MD, <Dictator> Charise Carwin MD ELECTRONICALLY SIGNED 10/26/2013 8:56

## 2014-05-09 NOTE — Op Note (Signed)
PATIENT NAME:  Jack CordsFARMER, Rage MR#:  [id number] DATE OF BIRTH:  1955/11/02  DATE OF PROCEDURE:  12/01/2013  PREOPERATIVE DIAGNOSIS:  End-stage renal disease with recent removal of PermCath and removal of arteriovenous graft for infection, in need of permanent dialysis access.   POSTOPERATIVE DIAGNOSIS:  End-stage renal disease with recent removal of PermCath and removal of arteriovenous graft for infection, in need of permanent dialysis access.   PROCEDURES: 1.  Ultrasound guidance for vascular access to right internal jugular vein.  2.  Fluoroscopic guidance for placement of catheter.  3. Placement of a 19 cm tip-to-cuff tunneled hemodialysis catheter via the right internal jugular vein.   SURGEON:  Festus BarrenJason Eldar Robitaille, MD.    ANESTHESIA: Local with sedation.   BLOOD LOSS:  Minimal.    INDICATION FOR PROCEDURE: A 59 year old gentleman who had an infection of a right arm AV graft recently that had to be excised and removed. He had had his PermCath removed as well a couple of weeks ago. Given his cultures clearing he now needed a new PermCath for permanent dialysis access. Risks and benefits were discussed. Informed consent was obtained.   DESCRIPTION OF THE PROCEDURE: The patient was brought to the vascular and interventional radiology suite. The patient's right neck and chest were sterilely prepped and draped and a sterile surgical field was created. The right internal jugular vein was visualized with ultrasound and found to be patent. It was then accessed under direct ultrasound guidance and a permanent image was recorded. A wire was placed. After a skin nick and dilatation, the peel-away sheath was placed over the wire.   I then turned my attention to an area under the clavicle. Approximately 2 fingerbreadths below the clavicle a small counter incision was created and we tunneled from the subclavicular incision to the access site. Using fluoroscopic guidance, a 19 cm tip-to-cuff tunneled hemodialysis  catheter was selected, tunneled from the subclavicular incision to the access site. It was then placed through the peel-away sheath and the peel-away sheath was removed. The catheter tips were parked in the right atrium. The appropriate distal connectors were placed. It withdrew blood well and flushed easily with heparinized saline and a concentrated heparin solution was then placed. It was secured to the chest wall with 2 Prolene sutures. The access incision was closed with a single 4-0 Monocryl. A 4-0 Monocryl pursestring suture was placed around the exit site. Sterile dressings were placed.   The patient tolerated the procedure well and was taken to the recovery room in stable condition.    ____________________________ Annice NeedyJason S. Isaly Fasching, MD jsd:bu D: 12/01/2013 14:14:38 ET T: 12/01/2013 15:56:43 ET JOB#: [id number]  cc: Annice NeedyJason S. Dyana Magner, MD, <Dictator> Annice NeedyJASON S Arohi Salvatierra MD ELECTRONICALLY SIGNED 12/28/2013 11:41

## 2014-05-09 NOTE — Consult Note (Signed)
Chief Complaint:  Subjective/Chief Complaint Pt with ESRD, HTN, DM, seizure D/O from previous CVA now intubated with massive angioedema.   VITAL SIGNS/ANCILLARY NOTES: **Vital Signs.:   01-Nov-15 08:31  Temperature Temperature (F) 98.1  Pulse Pulse 79  Systolic BP Systolic BP 83  Diastolic BP (mmHg) Diastolic BP (mmHg) 43  Pulse Ox % Pulse Ox % 100  Oxygen Delivery Ventilator Assisted    08:45  Pulse Pulse 86  Systolic BP Systolic BP 93  Diastolic BP (mmHg) Diastolic BP (mmHg) 53  Pulse Ox % Pulse Ox % 100  Oxygen Delivery Ventilator Assisted    09:00  Pulse Pulse 84  Systolic BP Systolic BP 938  Diastolic BP (mmHg) Diastolic BP (mmHg) 88  Pulse Ox % Pulse Ox % 100  Oxygen Delivery Ventilator Assisted    09:30  Pulse Pulse 85  Systolic BP Systolic BP 101  Diastolic BP (mmHg) Diastolic BP (mmHg) 60  Pulse Ox % Pulse Ox % 100  Oxygen Delivery Ventilator Assisted    10:00  Pulse Pulse 87  Systolic BP Systolic BP 751  Diastolic BP (mmHg) Diastolic BP (mmHg) 74  Pulse Ox % Pulse Ox % 100  Oxygen Delivery Ventilator Assisted   Brief Assessment:  GEN critically ill appearing   Cardiac Regular   Respiratory rhonchi   Gastrointestinal details normal Soft  Nontender  Bowel sounds normal   EXTR negative edema   Lab Results: Routine Chem:  01-Nov-15 07:45   Glucose, Serum  120  BUN  52  Creatinine (comp)  14.83  Sodium, Serum 138  Potassium, Serum  5.8  Chloride, Serum 107  CO2, Serum 21  eGFR (African American)  4  Routine Hem:  01-Nov-15 07:45   WBC (CBC) 7.8  Hemoglobin (CBC)  8.9  Platelet Count (CBC) 415   Assessment/Plan:  Assessment/Plan:  Plan Nephrology and Pulmonary consults ordered. Agree with stopping ACEI. Follow sugars with accuchecks and SSI. Repeat labs and CXR in AM. Will follow with you.   Electronic Signatures: Idelle Crouch (MD)  (Signed [phone] 10:30)  Authored: Chief Complaint, VITAL SIGNS/ANCILLARY NOTES, Brief Assessment, Lab  Results, Assessment/Plan   Last Updated: 01-Nov-15 10:30 by Idelle Crouch (MD)

## 2014-05-09 NOTE — Consult Note (Signed)
PATIENT NAME:  [name], [name] MR#:  [id number] DATE OF BIRTH:  10/20/1955  DATE OF CONSULTATION:  11/25/2013  REFERRING PHYSICIAN:  Enid Baas, MD CONSULTING PHYSICIAN:  Stann Mainland. Sampson Goon, MD  REASON FOR CONSULTATION: Sepsis and infected AV graft.   HISTORY OF PRESENT ILLNESS: This is a 59 year old gentleman with end-stage renal disease on hemodialysis, who was originally admitted 11/16/2013 with severe angioedema. He lives in a nursing home and presented with severe angioedema requiring intubation. He was in the intensive care unit, followed by pulmonary and ENT, and had improvement. By 11/23/2013 was awaiting discharge to a skilled nursing facility. He had been extubated 11/18/2013. He had been tolerating hemodialysis as well. However, he started to develop high fevers, starting 11/24/2013, and it was noted he had drainage from his right forearm AV graft site. He has since been started on broad-spectrum antibiotics with vancomycin and cefepime. Blood cultures and culture of the drainage have been done. However, he remains quite febrile and somewhat hypotensive, and is getting increasingly confused. We are being consulted for further management.   PAST MEDICAL HISTORY: End-stage renal disease on dialysis since 2009, coronary artery disease, diabetes with neuropathy and nephropathy, hypertension, Hepatitis C, history of seizures, History of prostate cancer, history of strokes, history of prior incarceration, history of esophageal candidiasis.   PAST SURGICAL HISTORY: Multiple placements of fistulas and grafts.   SOCIAL HISTORY: He lives in a nursing home.   ALLERGIES: PENICILLIN, NAPROSYN, HYDRALAZINE, AND HYDROCODONE.   FAMILY HISTORY: Noncontributory.   REVIEW OF SYSTEMS: Unable to be obtained, due to the patient being confused.   ANTIBIOTICS SINCE ADMISSION INCLUDE: Vancomycin and cefepime, which he started today. He had also been on steroids with dexamethasone initially on admission.    PHYSICAL EXAMINATION:  VITAL SIGNS: Temperature 102.8, pulse 106, blood pressure 123/65, respirations 24, saturation 93%.  GENERAL: He is quite ill-appearing, lying in bed. He is obese. He is somewhat confused.  HEENT: Pupils are reactive.  OROPHARYNX: Clear.  NECK: Supple.  HEART: Tachy.  LUNGS: Have decreased breath sounds bilaterally.  ABDOMEN: Soft, obese, nontender.  EXTREMITIES: Left lower extremity 1+ edema bilaterally.  SKIN: He has multiple old AV fistula and graft sites. In his right arm, he had drainage from one of the graft sites, which is warm and swollen. There is thick purulent drainage.  NEUROLOGIC: He is somewhat confused and lethargic.   LABORATORY DATA: Blood cultures are pending from today. White blood count 9.5, hemoglobin 8.0, platelets 270,000. Basic panel is consistent with end-stage renal disease.   IMAGING: Chest x-ray done today shows low lung volumes. Left IJ central venous catheter.   IMPRESSION: A 59 year old gentleman with end-stage renal disease, admitted initially with angioedema, extubated on 11/18/2013 and improving until today when he developed high fevers to 103 with shaking chills and found to have an abscess at his right arteriovenous graft site draining purulence.   RECOMMENDATIONS:  1.  Continue vancomycin.  2.  I have added clindamycin at least temporarily, in case there is any toxin production. This can be backed off once cultures are available.  3.  Add meropenem to cover broadly until more stable.  4.  Supportive care per primary team.  5.  Vascular consult to address the infected graft.  6.  He will eventually likely need removal of his left central venous catheter in his neck.   Thank you for the consult. I will be glad to follow with you.    ____________________________ Stann Mainland. Sampson Goon, MD dpf:MT D:  11/25/2013 15:04:21 ET T: 11/25/2013 15:38:47 ET JOB#: [id number]  cc: Onalee Huaavid P. Sampson GoonFitzgerald, MD, <Dictator> Jacquis Paxton Sampson GoonFITZGERALD  MD ELECTRONICALLY SIGNED 11/25/2013 17:38

## 2014-05-09 NOTE — Discharge Summary (Signed)
PATIENT NAME:  [name], Jack MR#:  [id number] DATE OF BIRTH:  Aug 16, 1955  DATE OF ADMISSION:  11/06/2013 DATE OF DISCHARGE:  11/07/2013  For detailed note, please take a look at the history and physical done on admission by Dr. Sheryle Hailiamond.   DIAGNOSES AT DISCHARGE:  As follows:  Suspected cerebrovascular accident but now ruled out, end stage renal disease, on hemodialysis, hypertension, hyperlipidemia, history of previous cerebrovascular accident, diabetes, diabetic neuropathy, chronic pain, chronic constipation, secondary hyperparathyroidism.   DIET: The patient is being discharged on a low-sodium, low-fat, American Diabetic Association renal diet.   ACTIVITY: As tolerated.   FOLLOWUP: With the primary care physician at the group home.   DISCHARGE MEDICATIONS: As follows: Nephro-Vite 1 tablet daily, minoxidil 2.5 mg 2 tabs daily, Lasix 40 mg daily, Plavix 75 mg daily Sensipar 60 mg daily, terbinafine topical to be applied b.i.d., Humulin 70/30 fifteen units b.i.d., gabapentin 300 mg b.i.d., Renvela 800 mg 4 caps t.i.d., nortriptyline 50 mg at bedtime, ketoconazole topical cream to be applied once daily at bedtime, diphenhydramine 50 mg 1 tab b.i.d., Dulcolax 2 tabs as needed, lisinopril 20 mg daily, Dilantin 300 mg at bedtime, clonidine 0.1 mg b.i.d., sucralfate 10 mL q.i.d., Protonix 40 mg b.i.d., oxycodone 5 mg q. 6 hours as needed, lactulose 15 mL b.i.d. as needed for constipation.   CONSULTANTS DURING THE HOSPITAL COURSE: Mosetta PigeonHarmeet Singh, MD from nephrology.                         PERTINENT STUDIES DONE DURING THE HOSPITAL COURSE: As follows: A CT scan of the head done without contrast on admission showing age-indeterminate 4 cm infarct involving the right parietal lobe, no associated hemorrhage, bilateral cerebral infarcts, right greater than the left, no acute intracranial traumatic injury. An ultrasound of the carotids showing no evidence of any hemodynamically significant carotid artery  stenosis. A MRI of the brain showing no acute evidence of acute stroke, extensive chronic ischemic change.   HOSPITAL COURSE: This is a [age] year old male with medical problems as mentioned above who presented to the hospital after suffering a fall and underwent a CT scan of his head in the ER showing a suspected CVA.  1.  Suspected CVA. This was the clinical diagnosis on admission given the fact that the patient presented with a fall and his CT scan showed an age-indeterminate stroke. The patient although did not have any clinical symptoms for a stroke like any focal weakness, numbness or paresthesias. The patient underwent an extensive workup including a carotid duplex and MRI of the brain, which were essentially normal with no evidence of acute stroke. The patient is clinically back to baseline and therefore being discharged back to the group home.  2.  End stage renal disease, on hemodialysis. The patient did get dialysis on Thursday, which he tolerated well. He will continue his scheduled on Tuesday, Thursday, Saturday as stated.  3.  Diabetes. The patient had no hypoglycemic episodes. The patient was maintained on sliding scale insulin but will continue his 70/30 upon discharge.  4.  History of seizures. The patient had no acute seizure-type activity. He will continue his Dilantin.  5.  Hypertension. The patient remained hemodynamically stable. He will continue his clonidine and lisinopril.  6.  Secondary hyperparathyroidism. The patient was maintained on his Renvela. He will resume that.  7.  Diabetic neuropathy. The patient was maintained on his Neurontin. He will resume that.  8.  Chronic pain. The patient is  being discharged on some as needed oxycodone.   9.  Constipation. This is chronic for the patient given his chronic use of narcotics. He received some Dulcolax suppository and lactulose and his consultation has now resolved. He is being discharged on the Dulcolax as needed along with lactulose  as needed.   CODE STATUS: The patient is a full code.   DISPOSITION: He is being discharged back to his group home.   TIME SPENT ON DISCHARGE: 40 minutes.      ____________________________ Rolly Pancake. Cherlynn Kaiser, MD vjs:AT D: 11/07/2013 14:50:56 ET T: 11/08/2013 02:32:59 ET JOB#: [id number]  cc: Rolly Pancake. Cherlynn Kaiser, MD, <Dictator> Houston Siren MD ELECTRONICALLY SIGNED 11/24/2013 11:04

## 2014-05-09 NOTE — H&P (Signed)
PATIENT NAME:  [name], [name] MR#:  [id number] DATE OF BIRTH:  25-Jun-1955  DATE OF ADMISSION:  11/16/2013  The patient is a 59 year old African-American male who is living a nursing facility. He has end-stage renal disease on dialysis. He has multiple allergies. He woke up this morning with acute swelling of his tongue and angioedema. He apparently had some remote history of angioedema previously when he lived in [place] but has not had any problems since that time. He knows he is allergic to some things. He has been on lisinopril. It is one of his medications. He continues to try to talk some, but he is almost unintelligible because his tongue is so big and sticking out of his mouth so far that he cannot articulate his words. He does not feel short of breath right now. He is able to breathe through his nose okay. He was seen in the Emergency Room. It was felt that it was best to take him up and not do a flexible laryngoscopy in the ER but evaluate his larynx in the operating room since right now his airway was still okay.   PAST MEDICAL HISTORY: Significant for chronic renal failure on hemodialysis. He has hypoglycemia, hypoparathyroidism, history of hypertension, seizure disorder, hepatitis C, coronary artery disease, diabetes mellitus type 2, history of prostate cancer, and previous CVA.   CURRENT MEDICATIONS: Include clonidine 0.1 mg twice a day, Renvela 800 mg 4 tablets 3 times daily with meals, Phenytoin 100 mg 3 capsules at bedtime, Carafate 1 gram per 10 mL suspension 2 teaspoonfuls 4 times a day, nortriptyline 50 mg at bedtime, Protonix 40 mg 2 times a day, Sensipar 60 mg once daily, clopidogrel 75 mg once a day, furosemide 40 mg once a day, minoxidil 2.5 mg 2 tablets once a day, Nephro-Vite tablet 1 tablet once a day, lisinopril 20 mg once daily, gabapentin 300 mg twice a day, Humulin 70/30 at 15 units subcutaneously twice a day, oxycodone 5 mg q. 6 hours p.r.n. pain, and lactulose 10 grams per 15  mL dose 15 mL 2 times a day p.r.n. constipation.   DRUG ALLERGIES: PENICILLIN, NAPROXEN, HYDRALAZINE, AND HYDROCODONE.   REVIEW OF SYSTEMS: Unable to do secondary to the patient's massive tongue and asking him to try not to talk so much, not move his tongue.   PHYSICAL EXAMINATION:  GENERAL: The patient is alert and not in acute distress but has a very unstable airway. His tongue is sticking way out of his mouth. He is drooling. He is breathing through his nose only because he cannot get air around his tongue.  NECK: Soft. There is no swelling in the neck part or around the larynx. It is relatively thin. You can feel his landmarks well. There are no masses in the neck.  LUNGS: Have some upper airway sounds but seem to have good air flow. His oxygen saturations are 99% on room air.  HEART: Shows a regular rate and rhythm without murmur. He has multiple scars on his abdomen but it is nontender.  MUSCULOSKELETAL: He has got scarring on his arms and very poor veins from previous trying to find access for his dialysis. NEUROLOGICAL: He is alert and oriented x 3. His speech is somewhat dysarthric secondary to the enlarged tongue.   IMPRESSION: He has acute angioedema with airway compromise and a very unstable airway. He is taken directly to the OR for intubation to be able to secure this airway.   ____________________________ Cammy Copa, MD phj:jh D:  11/16/2013 08:36:00 ET T: 11/16/2013 12:31:37 ET JOB#: [id number]  cc: Cammy CopaPaul H. Antion Andres, MD, <Dictator> Cammy CopaPAUL H Arthurine Oleary MD ELECTRONICALLY SIGNED 11/20/2013 9:58

## 2014-05-09 NOTE — Consult Note (Signed)
PATIENT NAME:  [name], [name] MR#:  [id number] DATE OF BIRTH:  1955/06/18  DATE OF CONSULTATION:  11/16/2013  REFERRING PHYSICIAN:  Cammy CopaPaul H. Juengel, MD CONSULTING PHYSICIAN:  Duane LopeJeffrey D. Judithann SheenSparks, MD  REASON FOR CONSULTATION: Medical management.   FAMILY PHYSICIAN: Nonlocal.   HISTORY OF PRESENT ILLNESS: The patient is a 59 year old male with a history of end-stage renal disease on hemodialysis. He also has a history of previous stroke with seizures, hypertension, and diabetes. He presented to the Emergency Room last night with massive swelling of the tongue and lips. Due to the airway obstruction, the patient was taken to the Emergency Room and intubated. He was then brought to the Intensive Care Unit where consultation was subsequently requested. The patient is currently intubated and sedated and unable to give a history.   PAST MEDICAL HISTORY:  1. End-stage renal disease, on hemodialysis.  2. Type 2 diabetes.  3. Previous stroke.  4. Seizure disorder.  5. Severe GE reflux disease with esophagitis.  6. Benign hypertension.  7. History of prostate cancer, status post prostatectomy. 8. History of hepatitis C.   MEDICATIONS ON ADMISSION: 1. Carafate 1 gram p.o. before meals and at bedtime.  2. Sensipar 60 mg p.o. daily.  3. Renvela 4 tablets p.o. t.i.d.  4. Dilantin 300 mg p.o. at bedtime.  5. Protonix 40 mg p.o. b.i.d.  6. Oxycodone 5 mg p.o. q. 6 hours p.r.n.  7. Nortriptyline 50 mg p.o. at bedtime.  8. Nephro-Vite 1 p.o. daily, 9. Minoxidil 5 mg p.o. daily.  10. Lisinopril 20 mg p.o. daily.  11. Lactulose 15 mL b.i.d. as needed.  12. Humulin 70/30 at 15 units subcutaneous b.i.d.  13. Gabapentin 300 mg p.o. b.i.d.  14. Lasix 40 mg p.o. daily.  15. Benadryl 50 mg p.o. t.i.d.  16. Plavix 75 mg p.o. daily.  17. Clonidine 0.1 mg p.o. b.i.d.   ALLERGIES: PENICILLIN, NAPROXEN, HYDRALAZINE, AND HYDROCODONE.   SOCIAL HISTORY: Negative for alcohol or tobacco abuse.   FAMILY HISTORY:  Unknown as the patient is unable to respond and there is no family available. No previous records are available.  REVIEW OF SYSTEMS: Unable to obtain due to patient's sedation and intubation.   PHYSICAL EXAMINATION:  GENERAL: The patient is critically ill-appearing, sedated on the vent.  VITAL SIGNS: Currently remarkable for a blood pressure of 150/80 with a heart rate of 80 and temperature of 98.1. Saturations are 94% on the ventilator.  HEENT: Normocephalic, atraumatic. Pupils are minimally reactive. Oropharynx revealed severe swelling of the tongue and lips with intubation. NECK: Supple without adenopathy. No obvious thyromegaly is noted. No JVD.  LUNGS: Clear except for some scattered rhonchi. No wheezes or rales. No dullness. Respiratory effort is ventilator initiated.  CARDIAC: Regular rate and rhythm with a normal S1, S2. No significant rubs or gallops. PMI is nondisplaced. Chest wall is nontender.  ABDOMEN: Soft, nontender, with normoactive bowel sounds. No organomegaly or masses were appreciated. No hernias or bruits were noted.  EXTREMITIES: Without clubbing, cyanosis, edema. Pulses were 2+ bilaterally.  SKIN: Warm and dry without rash or lesions.  NEUROLOGIC: Unable to be performed due to the patient's sedation.  PSYCHIATRIC: Unable to be performed due to the same reason.   LABORATORY DATA: Glucose 120 with a BUN of 52, creatinine of 14.83, sodium of 138, and a potassium of 5.8. His GFR was 4. Transaminase is normal. White count was 7.8 with a hemoglobin of 8.9. ABG on the vent revealed a pH of 7.36 with a  pCO2 of 37 and a pO2 of 67 with an FiO2 of 30 and a saturation of 92.4%.   ASSESSMENT:  1. Massive angioedema requiring airway protection with intubation.  2. Type 2 diabetes, on insulin.  3. End-stage renal disease on hemodialysis.  4. Anemia of chronic disease.  5. Benign hypertension, stable.  6. Hyperkalemia.  7. Seizure disorder.   PLAN: Will continue intubation.  Pulmonary and nephrology consults have been ordered. We will switch his Lasix to IV at this time until dialysis can be performed. We will add sliding scale insulin and follow him with Accu-Cheks q. 6 hours while he is n.p.o. The patient does have an orogastric tube. We will continue his oral medications through the nasogastric tube and follow his blood pressure closely. We will follow-up labs in the morning, including a Dilantin level. Dialysis plans per nephrology.  Thank you for the consultation. We will continue to follow this patient while in the hospital. Please call if questions arise.  TOTAL TIME SPENT ON THIS PATIENT: 45 minutes.    ____________________________ Duane Lope Judithann Sheen, MD jds:jh D: 11/16/2013 10:26:21 ET T: 11/16/2013 15:33:16 ET JOB#: [id number]  cc: Duane Lope. Judithann Sheen, MD, <Dictator> Yanett Conkright Rodena Medin MD ELECTRONICALLY SIGNED 11/16/2013 16:49

## 2014-05-09 NOTE — Op Note (Signed)
PATIENT NAME:  [name], [name] MR#:  [id number] DATE OF BIRTH:  [birth year]  DATE OF PROCEDURE:  10/22/2013  PREOPERATIVE DIAGNOSES:  1.  End-stage renal disease.  2.  Aneurysmal right arm arteriovenous graft, requiring surgical revision.  3.  Hypertension.  4.  Diabetes.  5.  Poor venous access.  6.  Persistent nausea and vomiting.   PROCEDURES:  1.  Ultrasound guidance for vascular access, right femoral vein.  2.  Placement of a right femoral venous triple-lumen catheter.   SURGEON: Annice NeedyJason S Samamtha Tiegs, MD.   ANESTHESIA:  None, as it was done prior to his OR procedure and he was already under anesthesia for this.   BLOOD LOSS: Minimal.   INDICATION FOR PROCEDURE: A 59 year old gentleman with end-stage renal disease. He has a right arm AV graft that we have to do a surgical revision on. For reasons that are entirely unclear to me, somebody put a PICC line in the right arm brachial vein near the surgical site. This obviously has to be removed and he had no other viable venous access, just had a jugular PermCath placed and has very limited access options due to previous procedures for his dialysis. A femoral line is placed for this reason.  DESCRIPTION OF PROCEDURE: The patient's right groin was sterilely prepped and draped and a sterile surgical field was created. The right femoral vein was visualized with ultrasound and found to be widely patent. It was then accessed with direct ultrasound guidance without difficulty with a Seldinger needle. A J-wire was then placed after skin nick and dilatation, a triple-lumen catheter was placed over the wire and the wire was removed. All 3 lumens withdrew dark red, nonpulsatile blood and flushed easily with sterile saline. It was secured to the skin at 20 cm with 3 silk sutures. Sterile dressing was placed. The patient tolerated the procedure well and his surgical procedure will be dictated separately.   ____________________________ Annice NeedyJason S. Curvin Hunger,  MD jsd:lt D: 10/22/2013 11:46:25 ET T: 10/22/2013 15:25:53 ET JOB#: [id number]  cc: Annice NeedyJason S. Fabian Coca, MD, <Dictator> Annice NeedyJASON S Cystal Shannahan MD ELECTRONICALLY SIGNED 11/01/2013 12:34

## 2014-05-09 NOTE — Op Note (Signed)
PATIENT NAME:  [name], [name] DATE OF BIRTH:  [birth year]  DATE OF PROCEDURE:  11/16/2013  PREOPERATIVE DIAGNOSES:  1.  Acute angioedema with severe tongue swelling. 2.  Airway obstruction.   POSTOPERATIVE DIAGNOSES: 1.  Acute angioedema with severe tongue swelling. 2.  Airway obstruction.  OPERATIVE PROCEDURE: Flexible laryngoscopy and then intubation.   SURGEON: Cammy CopaPaul H. Kadeen Sroka, MD   ANESTHESIA: General.   COMPLICATIONS: None.   DESCRIPTION OF PROCEDURE: The patient is taken from the Emergency Room immediately to the operating suite. He is left on the ER bed, his tongue is sticking way out of his mouth. He is still oxygenating okay. Some sedation is given and a flexible pediatric bronchus placed through the left nostril to visualize the hypopharynx and larynx. The vocal cords are tried to be visualized. He has a small omega-shaped epiglottis. It is difficult to see , a lot of thick mucus back here. Much of this was suctioned out. Eventually, I could see the epiglottis and it opened up and you could see the cords. There is no real swelling here yet and so it was felt that we could safely try to orally intubate the patient and see what kind of look we got. The flexible scope was done by the Dr. Maisie Fushomas, the anesthesiologist.   A glide scope was used to visualize the hypopharynx, try to get behind the tongue. This showed the omega-shaped epiglottis and oral endotracheal tube was tried to be placed, however, he was coughing, bucking some and it was going behind the larynx. He was oxygenated further and then another view was done with the glide scope. Difficult trying to get into the larynx, he was starting to get some swelling around his epiglottis and hypopharynx and had some bruising on the right area epiglottic fold. No significant bleeding or signs of tears here. The regular back blade was then used for visualizing this area and he was orally intubated by anesthesia. They were able  to get it into the trachea with good airway.   Once the airway was in, the flexible bronchoscope was placed back through the oral endotracheal trach tube to visualize its placement. It was sitting right at the carina at 26 cm, it was pulled back under direct visualization until it was about 3-4 cm above the carina. This was sitting 22-23 cm at his right oral lip margin. This was secured. The patient tolerated the procedure well. He was taken directly to the ICU in satisfactory condition.   ____________________________ Cammy CopaPaul H. Miciah Covelli, MD phj:TT D: 11/16/2013 08:28:42 ET T: 11/16/2013 19:46:42 ET JOB#: [id number]  cc: Cammy CopaPaul H. Teniyah Seivert, MD, <Dictator>

## 2014-05-09 NOTE — Discharge Summary (Signed)
PATIENT NAME:  [name], [name] MR#:  [id number] DATE OF BIRTH:  02/22/1955  DATE OF ADMISSION:  11/16/2013  DATE OF DISCHARGE:  12/01/2013  ADDENDUM  This is a final discharge summary on [name]. This is mainly an addendum to the interim discharge summary dictated by Dr. Nemiah CommanderKalisetti on 11/16.   The patient is overall doing well. He received his permanent hemodialysis catheter on 11/16/ 2015, by Dr. Wyn Quakerew. His Trialysis catheter will be removed after he receives 1 unit of blood transfusion today. The patient consented for blood transfusion. His hemoglobin was down to 6.7 and will receive blood transfusion thereafter. His Trialysis catheter will be removed.  He will finish up 6 weeks of IV vancomycin treatment at hemodialysis. His last dose will be 01/09/2014. The rest of his medical problems remained relatively stable.   DIET:  Renal ADA 1800 calorie, 2-gram sodium diet.   DISCHARGE INSTRUCTIONS:   1.  Continue physical therapy.  2.  Wound VAC care and dressing changes per protocol.  3.  Resume hemodialysis Tuesday, Thursday and Saturday.  4.  Continue physical therapy.   DISCHARGE MEDICATIONS: 1.  Clonidine 0.1 mg b.i.d.  2.  Plavix 75 mg daily.  3.  Minoxidil 5 mg daily.  4.  Protonix 40 mg daily.  5.  NovoLog 70/30 15 units b.i.d.  6.  Nortriptyline 25 mg at bedtime.  7.  Calcium acetate 2 capsules orally t.i.d. with meals.  8.  Oxycodone immediate release 5 mg q. 6 p.r.n.  9.  Gabapentin 300 mg t.i.d.  10.  Docusate 100 mg b.i.d.  11.  Oxycodone SR 15 mg b.i.d.  12.  Tylenol 650 q. 4 p.r.n.  13.  Atorvastatin 20 mg daily.  14.  Allopurinol 100 mg q. 48 hourly.  15.  Phenytoin 50 mg at bedtime.  16.  Phenytoin ER 100 mg at bedtime.  17.  Sliding scale insulin.  18.  Eucerin cream.  Apply to affected area b.i.d.  19.  MiraLax 17 grams orally daily p.r.n.  20.  Senokot 1 tablet p.o. b.i.d.  21.  Silvadene 1% cream, apply to affected area daily.  22.  IV vancomycin at  hemodialysis.  Pharmacy to dose. Last dose will be on 01/10/2014.   LABORATORY DATA: Hemoglobin is 6.7, hematocrit is 21.2. The patient will receive blood transfusion today prior to discharge. White count is 6.6. Repeat blood cultures, November 13th remains negative. Potassium is 4.4. Albumin is 1.9. Echo Doppler showed EF of 40% to 45%, moderate apical, anteroseptal MI. Moderately dilated left atrium, mildly dilated right atrium, mild mitral valve regurgitation, mild tricuspid regurgitation, apical/septal hypokinesis. Catheter tip culture, no growth seen in 3 days.   TIME SPENT: 40 minutes.    The patient remained a full code.    ____________________________ Wylie HailSona A. Allena KatzPatel, MD sap:DT D: 12/02/2013 13:00:40 ET T: 12/02/2013 14:32:45 ET JOB#: [id number]  cc: Donald Jacque A. Allena KatzPatel, MD, <Dictator> Willow OraSONA A Cassie Shedlock MD ELECTRONICALLY SIGNED 12/18/2013 14:07

## 2014-05-09 NOTE — Op Note (Signed)
PATIENT NAME:  [name], Abdo MR#:  [id number] DATE OF BIRTH:  May 05, 1955  DATE OF PROCEDURE:  11/25/2013  PREOPERATIVE DIAGNOSES: 1.  Complication of dialysis device with infection of arteriovenous graft.  2.  End-stage renal disease, requiring hemodialysis.  3.  Sepsis.   POSTOPERATIVE DIAGNOSES:  1.  Complication of dialysis device with infection of arteriovenous graft.  2.  End-stage renal disease, requiring hemodialysis.  3.  Sepsis.   PROCEDURE PERFORMED: Insertion of right femoral triple-lumen temporary dialysis catheter with ultrasound guidance.   SURGEON:  Renford DillsGregory G. Riyana Biel, MD  DESCRIPTION OF PROCEDURE: The patient is in his hospital bed. He is positioned supine. The right groin is prepped and draped in a sterile fashion. Appropriate timeout is called. Ultrasound is placed in a sterile sleeve. Femoral vein is compressible and echolucent, indicating patency. Image is recorded for the permanent record and under real-time visualization, after lidocaine has been inserted into the subcutaneous tissues, a Seldinger needle is inserted under direct visualization. Wire is advanced, followed by dilators. A triple-lumen catheter is then advanced without difficulty. All 3 lumens aspirate and flush easily. The catheter is secured to the skin of the thigh with 2-0 nylon. The patient tolerated the procedure well and there were no immediate complications.    ____________________________ Renford DillsGregory G. Rorik Vespa, MD ggs:LT D: 11/25/2013 18:04:37 ET T: 11/25/2013 19:46:31 ET JOB#: [id number]  cc: Renford DillsGregory G. Tank Difiore, MD, <Dictator> Renford DillsGREGORY G Lou Loewe MD ELECTRONICALLY SIGNED 11/26/2013 8:00

## 2014-05-09 NOTE — Consult Note (Signed)
Pt without abd pain, chest pain is less.  He is eating more.  No vomiting, he is moving bowels well.  i  have changed his reglan to oral and changed dosing to tid before meals. He has severe esophagitis and multiple antral ulcers but symptoms improving.  He may be discharged over weekend.  Dr. Marva PandaSkulskie is covering for me but he will not see unless you call about a problem.  Electronic Signatures: Scot JunElliott, Arrian Manson T (MD)  (Signed on 09-Oct-15 17:46)  Authored  Last Updated: 09-Oct-15 17:46 by Scot JunElliott, Jullian Clayson T (MD)

## 2014-05-09 NOTE — Consult Note (Signed)
PATIENT NAME:  Jack CordsFARMER, Mirl MR#:  [id number] DATE OF BIRTH:  1955-12-17  DATE OF CONSULTATION:  10/21/2013  REFERRING PHYSICIAN:  Katharina Caperima Vaickute, MD CONSULTING PHYSICIAN:  Lynnae Prudeobert Elliott, MD/Landis Dowdy A. Arvilla MarketMills, ANP (Adult Nurse Practitioner)  REASON FOR CONSULTATION: Nausea and vomiting.   HISTORY OF PRESENT ILLNESS: This 59 year old patient presented to the hospital on 10/16/2013 with a 1 week history of abdominal pain with worsening distention and was found to have obstipation on x-ray. He was having nausea and vomiting. The patient was treated with soapsuds enema. He did have a CT of the abdomen on 10/19/2013 following that showed nonobstructive gas pattern, no evidence of significant fecal retention. There was bilateral renal atrophy consistent with his renal disease history. There was also distal esophageal wall thickening noted. The liver and gallbladder were reported as normal. The patient has continued to have episodes daily of nausea, vomiting and nursing staff has reported coffee-ground emesis. He has maintained a normal hemoglobin today at 14.7, although his WBCs have been climbing at 24.5 today. The patient has had unremarkable chest x-ray. He has had dialysis catheter placement today. He had a PICC line placed for IV access. He is currently receiving dialysis on a Tuesday, Thursday, Saturday schedule.   The patient has been given Phenergan and Zofran. He is extremely drowsy during the interview and only able to tell me that he has had abdominal pain and points with both fingers to the generalized abdomen. He cannot give any more history. He says he has been throwing up, but cannot give history. He denies history of EGD or colonoscopy although he is, at this point, sedated and a poor historian.   PAST MEDICAL HISTORY:  1. Coronary artery disease.  2. End-stage renal disease.  3. Hypertension.  4. Secondary hyperparathyroidism.  5. Hepatitis C.  6. Diabetes mellitus.  7. History of  prostate cancer, previous prostatectomy.  8. 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 [facility].   MEDICATIONS ON ADMISSION: See H and P note. The patient says he does not take any medicines.   ALLERGIES:  1. HYDRALAZINE.  2. HYDROCODONE.  3. NAPROXEN.   4. PENICILLIN.   HABITS: Negative tobacco. Negative alcohol.   FAMILY HISTORY: Unable to be obtained at this time.   REVIEW OF SYSTEMS: He reports generalized abdominal discomfort and nausea and vomiting. He does not give any other history at this time. He has received medication for nausea and appears sedated.   PHYSICAL EXAMINATION:  VITAL SIGNS: 98.4, 112, 118, 125/86, pulse oximetry on room air is 94%.  GENERAL: Large framed African American male sleeping in bed, NAD.  HEENT: Shows sclerae is anicteric.  NECK: Supple. He does have JVD on the right, but hemodialysis catheter is in use. A PermCath dialysis catheter is in place and in use, right jugular.  HEART: Tones are regular.  LUNGS: CTA anteriorly.  ABDOMEN: Soft, cannot elicit tenderness with palpation.  RECTAL: Deferred.  EXTREMITIES: Lower extremities with multiple bruising, no edema.  SKIN: A little clammy in the chest, warm and dry otherwise.  NEUROLOGIC: The patient is drowsy likely secondary to recent Phenergan, opens eyes. Speaks little and falls back to sleep.   LABORATORY DATA: Today with glucose 225, BUN 100, creatinine 16.28, sodium 132, potassium 5.8 currently undergoing hemodialysis. Albumin 3.2. Admission WBC at 10.9 and now up to 24.5. Hemoglobin is 14.7, platelet count 345,000. Albumin 3.9, alkaline phosphatase 159, AST 24. He has received IV  clindamycin.   RADIOLOGY: Three-way of the abdomen on 10/16/2013 showed increased stool burden throughout the colon consistent with constipation. No small or large bowel obstruction noted.   CT of the abdomen and pelvis without contrast performed 10/19/2013  showed distal esophageal wall thickening, nonobstructive gas pattern with no evidence of significant fecal retention and  bilateral renal atrophy consistent with history.   IMPRESSION: The patient has end-stage renal disease, coronary artery disease, hypertension, apparently long-standing diabetes mellitus, and he presents with generalized abdominal pain and distention and constipation. He also has had nausea and vomiting. It appears that the constipation has resolved with treatment. He is left with significant nausea and vomiting with nursing staff reporting coffee-ground emesis and poor oral intake. He has been treated with proton pump inhibitor, Carafate, Reglan and continues to have symptoms. The patient reports generalized abdominal discomfort. No significant areas of tenderness on palpation and CT study performed 2 days ago was unremarkable with the exception of distal esophageal wall thickening.   PLAN: Recommend EGD for tomorrow afternoon. Dr. Mechele Collin was consulted and we discussed this case in collaboration of care. The etiology for his nausea and vomiting certainly can be due to ulcer disease as dialysis patients have a higher incidence. He also could have gastroparesis secondary to medications and diabetes mellitus. Esophageal thickening noted to rule out esophagitis. Further GI recommendations pending luminal evaluation.   Thank you for the consultation.   These services provided by Cala Bradford A. Arvilla Market, MS, APRN, BC, ANP under collaborative agreement with Lynnae Prude, MD.     ____________________________ Ranae Plumber Arvilla Market, ANP (Adult Nurse Practitioner) kam:JT D: 10/21/2013 17:20:25 ET T: 10/21/2013 18:14:07 ET JOB#: [id number]  cc: Cala Bradford A. Arvilla Market, ANP (Adult Nurse Practitioner), <Dictator> Ranae Plumber Suzette Battiest, MSN, ANP-BC Adult Nurse Practitioner ELECTRONICALLY SIGNED 10/24/2013 11:04

## 2014-05-09 NOTE — Op Note (Signed)
PATIENT NAME:  Jack CordsFARMER, Deamonte MR#:  [id number] DATE OF BIRTH:  1955/04/30  DATE OF PROCEDURE:  10/22/2013  PREOPERATIVE DIAGNOSES:  1.  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  2.  Hepatitis C.  3.  Hypertension.  4.  End-stage renal disease.   POSTOPERATIVE DIAGNOSES: 1.  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  2.  Hepatitis C.  3.  Hypertension.  4.  End-stage renal disease.   PROCEDURES: 1.  Ligation of right arm arteriovenous graft to exclude aneurysms.  2.  Revision of right arm arteriovenous graft with a 7 mm diameter Artegraft.   SURGEON: Festus BarrenJason Dew, MD   ANESTHESIA: General.   BLOOD LOSS: 50 mL.   INDICATION FOR PROCEDURE: This is a 59 year old gentleman who was admitted to the hospital last week for other issues but was found to have a markedly worrisome right arm AV graft in his forearm. His arterial and venous access sites both had skin breakdown and skin that was very unhealthy with large aneurysms. I felt at this point this graft was likely not salvageable and I recommended surgical revision. Risks and benefits were discussed. Informed consent was obtained.   DESCRIPTION OF PROCEDURE: The patient was brought to the operative suite and after an adequate level of general anesthesia obtained, the right upper extremity was sterilely prepped and draped and a sterile surgical field was created. The incision was created overlying the graft between the aneurysmal portions and the anastomosis both at the arterial and the venous sites. The arterial side was dissected out first and dissected free for revision. It was then encircled with a vessel loop. Similar was done on the venous site. I then used a most curved tunneler and a small counter incision in the forearm and kind of toward the inside of the previous graft due to its orientation and location of the aneurysms. A 7 mm Artegraft was then tunneled, marked for  orientation and brought onto the field. The patient was then given 4000 units of intravenous heparin. The graft was clamped proximally near the arterial anastomosis, transected and ligated in the distal portion of the graft. The proximal portion was prepared for anastomosis with the Artegraft. End-to-end anastomosis was created with a CV-6 suture in the usual fashion with 2 CV-6 parachuting sutures. A single 6-0 Prolene patch suture was used for hemostasis. There was then good pulsatile flow through the graft and it was flushed and locally heparinized. The graft was then prepared near the venous anastomosis. It was clamped near the venous anastomosis and ligated more proximal to exclude the aneurysm. It was prepared for anastomosis to the Artegraft and again 2 CV-6 sutures were used to create an end-to-end anastomosis. The vessel was flushed and de-aired prior to releasing control. On release, there was excellent pulsatile flow through the graft. The wounds were irrigated. Surgicel and Evicel topical hemostatic agents were placed. Hemostasis complete. The wound was then closed with 3-0 Vicryl and 4-0 Monocryl and Dermabond was placed as a dressing. The patient tolerated the procedure well and was taken to the recovery room in stable condition.    ____________________________ Annice NeedyJason S. Dew, MD jsd:TT D: 10/22/2013 11:37:08 ET T: 10/22/2013 14:39:39 ET JOB#: [id number]  cc: Annice NeedyJason S. Dew, MD, <Dictator> Annice NeedyJASON S DEW MD ELECTRONICALLY SIGNED 11/01/2013 12:33

## 2014-05-09 NOTE — Op Note (Signed)
PATIENT NAME:  [name], Jack MR#:  [id number] DATE OF BIRTH:  11-24-1955  DATE OF PROCEDURE:  11/16/2013  PREOPERATIVE DIAGNOSES:  1.  Acute angioedema with severe tongue swelling. 2.  Airway obstruction.   POSTOPERATIVE DIAGNOSES: 1.  Acute angioedema with severe tongue swelling. 2.  Airway obstruction.  OPERATIVE PROCEDURE: Flexible laryngoscopy and then intubation.   SURGEON: Cammy CopaPaul H. Taneika Choi, MD   ANESTHESIA: General.   COMPLICATIONS: None.   DESCRIPTION OF PROCEDURE: The patient is taken from the Emergency Room immediately to the operating suite. He is left on the ER bed, his tongue is sticking way out of his mouth. He is still oxygenating okay. Some sedation is given and a flexible pediatric bronchus placed through the left nostril to visualize the hypopharynx and larynx. The vocal cords are tried to be visualized. He has a small omega-shaped epiglottis. It is difficult to see , a lot of thick mucus back here. Much of this was suctioned out. Eventually, I could see the epiglottis and it opened up and you could see the cords. There is no real swelling here yet and so it was felt that we could safely try to orally intubate the patient and see what kind of look we got. The flexible scope was done by the Dr. Maisie Fushomas, the anesthesiologist.   A glide scope was used to visualize the hypopharynx, try to get behind the tongue. This showed the omega-shaped epiglottis and oral endotracheal tube was tried to be placed, however, he was coughing, bucking some and it was going behind the larynx. He was oxygenated further and then another view was done with the glide scope. Difficult trying to get into the larynx, he was starting to get some swelling around his epiglottis and hypopharynx and had some bruising on the right area epiglottic fold. No significant bleeding or signs of tears here. The regular back blade was then used for visualizing this area and he was orally intubated by anesthesia. They were able  to get it into the trachea with good airway.   Once the airway was in, the flexible bronchoscope was placed back through the oral endotracheal trach tube to visualize its placement. It was sitting right at the carina at 26 cm, it was pulled back under direct visualization until it was about 3-4 cm above the carina. This was sitting 22-23 cm at his right oral lip margin. This was secured. The patient tolerated the procedure well. He was taken directly to the ICU in satisfactory condition.   ____________________________ Cammy CopaPaul H. Verne Cove, MD phj:TT D: 11/16/2013 08:28:00 ET T: 11/16/2013 19:46:42 ET JOB#: [id number]  Cammy CopaPAUL H Blong Busk MD ELECTRONICALLY SIGNED 11/20/2013 9:59

## 2014-05-09 NOTE — Op Note (Signed)
PATIENT NAME:  [name], [name] DATE OF BIRTH:  [birth year]  DATE OF PROCEDURE:  11/20/2013  PREOPERATIVE DIAGNOSIS: End-stage renal disease with functional permanent dialysis access.   POSTOPERATIVE DIAGNOSIS: End-stage renal disease with functional permanent dialysis access.   PROCEDURE: Removal of jugular PermCath.  SURGEON:  Annice NeedyJason S. Dew, MD.    INDICATION FOR PROCEDURE:  This is a 59 year old gentleman with end-stage renal disease. He had a surgical revision of his right arm AV graft performed several weeks ago. This is now working well and his catheter can be removed.    ANESTHESIA:  Local.  ESTIMATED BLOOD LOSS:  Minimal.  Risks and benefits were discussed and informed consent was obtained.   DESCRIPTION OF PROCEDURE:  The patient's right neck, chest and existing catheter were sterilely prepped and draped.  The area around the catheter was anesthetized copiously with 1% Lidocaine. The catheter was dissected out with curved hemostats until the cuff was freed from the surrounding fibrous sheath.  The fibrous sheath was transected and the catheter was then removed in its entirety using gentle traction.  Pressure was held and sterile dressings placed.    The patient tolerated the procedure well and was taken to the recovery room in stable condition.     ____________________________ Annice NeedyJason S. Dew, MD jsd:bu D: 11/20/2013 16:07:00 ET T: 11/20/2013 16:35:52 ET JOB#: [id number]  cc: Annice NeedyJason S. Dew, MD, <Dictator> Annice NeedyJASON S DEW MD ELECTRONICALLY SIGNED 12/01/2013 10:13

## 2014-05-09 NOTE — Consult Note (Signed)
Will add in nystatin, likely immunocompromised from all his medical problems.  Electronic Signatures: Scot JunElliott, Verbon Giangregorio T (MD)  (Signed on 07-Oct-15 17:01)  Authored  Last Updated: 07-Oct-15 17:01 by Scot JunElliott, Triston Skare T (MD)

## 2014-05-09 NOTE — Op Note (Signed)
PATIENT NAME:  Jack CordsFARMER, Arbie MR#:  [id number] DATE OF BIRTH:  Mar 29, 1955  DATE OF PROCEDURE:  10/20/2013  PREOPERATIVE DIAGNOSES:   1.  Endstage renal disease. 2.  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  3.  Multiple previous dialysis catheter insertions.  POSTOPERATIVE DIAGNOSES:  1.  Endstage renal disease. 2.  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  3.  Multiple previous dialysis catheter insertions.  PROCEDURES: 1.  Ultrasound guidance for vascular access to right internal jugular vein.  2.  Fluoroscopic guidance for placement of catheter.  3. Placement of a 19 cm tip-to-cuff tunneled hemodialysis catheter via the right internal jugular vein.  4.  Right jugular venogram and superior venacavogram.  SURGEON:  Festus BarrenJason Kemari Mares, MD  ANESTHESIA: Local with sedation.   BLOOD LOSS: Minimal.   INDICATION FOR PROCEDURE: A 59 year old gentleman with long-standing end-stage renal disease. He has a right arm AV graft that is markedly aneurysmal with skin threat and needs a surgical revision. He will need a dialysis catheter while this heals and to avoid any bleeding from the current graft. The risks and benefits were discussed. Informed consent was obtained.   DESCRIPTION OF THE PROCEDURE: The patient was brought to the vascular and interventional radiology suite. The patient's right neck and chest were sterilely prepped and draped and a sterile surgical field was created. The right internal jugular vein was visualized with ultrasound and found to be patent. It was then accessed under direct ultrasound guidance and a permanent image was recorded. There was not initially much blood return from the needle and so I placed a micropuncture sheath, which again did not return blood briskly. Given his multiple previous catheter placements, I was concerned there could be a venous occlusive issue and so I elected to perform a venogram through the  micropuncture sheath in the right jugular vein. Imaging was performed of the right jugular vein, innominate vein and superior vena cava. These were found to be patent without significant stenosis so I proceeded with catheter placement. After a skin nick and dilatation, the peel-away sheath was placed over the wire.   I then turned my attention to an area under the clavicle. Approximately 2 fingerbreadths below the clavicle a small counter incision was created and we tunneled from the subclavicular incision to the access site. Using fluoroscopic guidance, a 19 cm tip-to-cuff tunneled hemodialysis catheter was selected, tunneled from the subclavicular incision to the access site. It was then placed through the peel-away sheath and the peel-away sheath was removed. The catheter tips were parked in the right atrium. The appropriate distal connectors were placed. It withdrew blood well and flushed easily with heparinized saline and a concentrated heparin solution was then placed. It was secured to the chest wall with 2 Prolene sutures. The access incision was closed with a single 4-0 Monocryl. A 4-0 Monocryl pursestring suture was placed around the exit site. Sterile dressings were placed.   The patient tolerated the procedure well and was taken to the recovery room in stable condition.   ____________________________ Annice NeedyJason S. Hanna Ra, MD jsd:sb D: 10/20/2013 10:51:03 ET T: 10/20/2013 11:29:02 ET JOB#: [id number]  cc: Annice NeedyJason S. Shakeera Rightmyer, MD, <Dictator> Annice NeedyJASON S Zenita Kister MD ELECTRONICALLY SIGNED 11/01/2013 12:33

## 2014-05-09 NOTE — Op Note (Signed)
PATIENT NAME:  [name], [name] MR#:  [id number] DATE OF BIRTH:  08/15/1955  DATE OF PROCEDURE:  11/26/2013   PREOPERATIVE DIAGNOSIS: Infected right forearm loop graft.   POSTOPERATIVE DIAGNOSIS:  Infected right forearm loop graft.  PROCEDURE PERFORMED:   1.  Excision of infected right forearm loop graft.  2.  Repair of brachial artery defect using CorMatrix patch, xenograft patch.   SURGEON:  Renford Dills, MD.  FIRST ASSISTANT:  Annice Needy, MD.  ANESTHESIA: General by endotracheal intubation.   FLUIDS: Per anesthesia record.   ESTIMATED BLOOD LOSS: 250 mL.   SPECIMEN: Resected graft photographed for the permanent record.   INDICATIONS: [name] is a 59 year old gentleman who presented to the hospital with increasing signs and symptoms consistent with sepsis. On evaluation, his right forearm loop graft indurated, tender and there is purulent material noted. He is therefore undergoing excision with repair of the brachial artery. Risks and benefits were reviewed. The patient has agreed to proceed.   DESCRIPTION OF PROCEDURE: The patient is taken to the operating room and placed in  supine position. After adequate general anesthesia is induced and appropriate invasive monitors are placed, he is positioned supine with his right arm extended palm upward. Right arm is prepped and draped in a sterile fashion.   A linear incision is made just above the antecubital crease medially and the dissection is carried down to expose the brachial vein, as well as the brachial artery. Brachial artery is then looped with a silastic vessel loop to provide inflow control.   Elliptical incision is then made around the 2 areas of skin breakdown and the loop forearm graft is exposed. Dissection is carried along the graft down to the anastomoses.  The arterial anastomosis is identified and subsequently approximately 2.5 cm above the suture line, an 0 Ethibond ligature is utilized. In a similar fashion, an 0  Ethibond was ligature is used on the venous side as well. The graft is then transected so that it is easier to manipulate and the dissection of the arterial anastomosis is completed. The venous anastomosis is also then dissected as well. Suture line is identified and the graft is removed in its entirety through the suture line from the vein. The vein is then oversewn with a 5-0 Prolene.   In a similar fashion, the brachial artery inflow is identified. The suture line is then identified.  A profunda clamp is utilized to gain control, as well as the side of the previously looped brachial artery more proximally.  The stump of the graft is then removed with an 11 blade down at the suture line and all Gore-Tex material is shaved off. CorMatrix patch is then beveled, trimmed to an appropriate length and applied to repair the brachial artery defect using a running 6-0 Prolene. Flushing maneuvers are performed and flow was re-established to the hand. The suture line is intact and the wound is then irrigated.   The remaining portion of the loop graft is then removed in segments. The wounds are then irrigated. The area or the wound that was the access for taking down the vascular anastomoses of the graft is reapproximated with Vicryl, but left with skin open and a VAC dressing is then applied to the open wounds overlying the graft, as well as this one at the level of the antecubital fossa. The incision above the antecubital crease, which allowed for proximal access, is closed in layers followed by Monocryl closure for the skin.   The  patient tolerated the procedure well and there were no immediate complications.    ____________________________ Renford DillsGregory G. Idamay Hosein, MD ggs:DT D: 11/28/2013 10:35:23 ET T: 11/28/2013 11:09:16 ET JOB#: [id number]  cc: Renford DillsGregory G. Niema Carrara, MD, <Dictator>   Renford DillsGREGORY G Natividad Halls MD ELECTRONICALLY SIGNED 12/16/2013 13:01

## 2014-05-09 NOTE — Discharge Summary (Signed)
PATIENT NAME:  [name], [name] MR#:  [id number] DATE OF BIRTH:  October 28, 1955  DATE OF ADMISSION:  10/16/2013 DATE OF DISCHARGE:  10/25/2013    CONSULTATIONS: Nephrology,  Lamont DowdySarath Kolluru, MD,  Gastroenterology, Dr. Lynnae Prudeobert Elliott cardiovascular surgeon, Dr. Festus BarrenJason Dew.   DISCHARGE DIAGNOSES:  1.  Peptic ulcer disease with multiple ulcers in antrum.  2.  Severe reflex esophagitis with  possible candida component,  3.  End stage renal disease on dialysis. 4.  Anemia of chronic disease. 5.  Hypertension.  6.  Diabetes.  7.  Seizure disorder.   CONDITION: Stable.   CODE STATUS: FULL CODE.   PROCEDURES: EGD, Perm-A-Cath placement, AV graft revision.   HOME MEDICATIONS:  Please refer to the medication reconciliation list.     The patient needs home health and physical therapy with nurse.   DIET: Low-sodium, low-fat, low-cholesterol, ADA renal diet.   ACTIVITY: As tolerated.   FOLLOW-UP CARE: Follow with PCP, Dr. Mechele CollinElliott and Dr. Wynelle LinkKolluru within 1 to 2 weeks.   REASON FOR ADMISSION: Needing hemodialysis and severe abdominal epigastric pain.   HOSPITAL COURSE: The patient is a 59 year old African American male with a history of hypertension, ESRD, diabetes who was sent from group home due to abdominal pain for 1 week.  He also missed hemodialysis due to not feeling well.  For a detailed history and physical examination, please refer to the admission note dictated by Dr. Auburn BilberryShreyang Patel.  On admission date, the patient's laboratory data showed WBC 10.9, hemoglobin 13.6, troponin 0.07, BUN 84, creatinine 14.59.   Electrolytes were normal.  Lipase 568.  Initially the patient had severe abdominal pain, was suspected to be due to severe constipation.  The patient was treated with enema, Colace, Senna and MiraLax.   Constipation is better.  The patient still has abdominal pain, concerning for gastritis or gastroparesis.  Dr. Winona LegatoVaickute  requested a GI consult and started PPI and Carafate.  Dr. Mechele CollinElliott did an  EGD which showed multiple ulcers in antrum, severe erosive esophagitis with possible candida component.  Dr. Mechele CollinElliott suggested to start Diflucan, Nystatin, in addition to continue Protonix and Carafate, Reglan p.r.n.  He suggested stop many p.o. medications including aspirin, tramadol, Mucinex and so on. Please see the medication reconciliation list.   After the above-mentioned treatment, the patient's symptoms have much improved. He has no complaints of abdominal pain, nausea, vomiting.    For end-stage renal disease, the patient got dialysis during this time. The patient got Perm-A-Cath replacement with AV graft revision by Dr. Wyn Quakerew.   Hypertension, diabetes has been controlled.   According to physical therapy evaluation, the patient needs home PT and home health. The patient's vital signs are stable.  He is clinically stable and will be discharged back to his group home with home health and physical therapy.  I discussed the patient's discharge plan with the patient, nurse and social worker.   TIME SPENT: About 42 minutes.     ____________________________ Shaune PollackQing Adda Stokes, MD qc:DT D: 10/25/2013 13:24:05 ET T: 10/25/2013 14:11:03 ET JOB#: [id number]  cc: Shaune PollackQing Birdie Beveridge, MD, <Dictator> Shaune PollackQING Arias Weinert MD ELECTRONICALLY SIGNED 10/26/2013 12:51

## 2014-05-09 NOTE — Consult Note (Signed)
Pt sleeping and awoken for questions.  he denies abd pain at this time, swallowing a little better, Continue current course.  Noted that he was sent back to CCU for  chest pain.    Electronic Signatures: Scot JunElliott, Foster Sonnier T (MD)  (Signed on 08-Oct-15 18:36)  Authored  Last Updated: 08-Oct-15 18:36 by Scot JunElliott, Deanndra Kirley T (MD)

## 2014-05-09 NOTE — Consult Note (Signed)
PATIENT NAME:  [name], Boyd MR#:  [id number] DATE OF BIRTH:  10-26-1955  DATE OF CONSULTATION:  11/16/2013  REFERRING PHYSICIAN:  Cammy CopaPaul H. Juengel, MD CONSULTING PHYSICIAN:  Duane LopeJeffrey D. Judithann SheenSparks, MD  REASON FOR CONSULTATION: Medical management.   FAMILY PHYSICIAN: Nonlocal.   HISTORY OF PRESENT ILLNESS: The patient is a 59 year old male with a history of end-stage renal disease on hemodialysis. He also has a history of previous stroke with seizures, hypertension, and diabetes. He presented to the Emergency Room last night with massive swelling of the tongue and lips. Due to the airway obstruction, the patient was taken to the Emergency Room and intubated. He was then brought to the Intensive Care Unit where consultation was subsequently requested. The patient is currently intubated and sedated and unable to give a history.   PAST MEDICAL HISTORY:  1. End-stage renal disease, on hemodialysis.  2. Type 2 diabetes.  3. Previous stroke.  4. Seizure disorder.  5. Severe GE reflux disease with esophagitis.  6. Benign hypertension.  7. History of prostate cancer, status post prostatectomy at (Dictation Anomaly)<<MISSING DICTATION>>. 8. History of hepatitis C.   MEDICATIONS ON ADMISSION: 1. Carafate 1 gram p.o. before meals and at bedtime.  2. Sensipar 60 mg p.o. daily.  3. Renvela 4 tablets p.o. t.i.d.  4. Dilantin 300 mg p.o. at bedtime.  5. Protonix 40 mg p.o. b.i.d.  6. Oxycodone 5 mg p.o. q. 6 hours p.r.n.  7. Nortriptyline 50 mg p.o. at bedtime.  8. Nephro-Vite 1 p.o. daily, 9. Minoxidil 5 mg p.o. daily.  10. Lisinopril 20 mg p.o. daily.  11. Lactulose 15 mL b.i.d. as needed.  12. Humulin 70/30 at 15 units subcutaneous b.i.d.  13. Gabapentin 300 mg p.o. b.i.d.  14. Lasix 40 mg p.o. daily.  15. Benadryl 50 mg p.o. t.i.d.  16. Plavix 75 mg p.o. daily.  17. Clonidine 0.1 mg p.o. b.i.d.   ALLERGIES: PENICILLIN, NAPROXEN, HYDRALAZINE, AND HYDROCODONE.   SOCIAL HISTORY: Negative for  alcohol or tobacco abuse.   FAMILY HISTORY: Unknown as the patient is unable to respond and there is no family available. No previous records are available.  REVIEW OF SYSTEMS: Unable to obtain due to patient's sedation and intubation.   PHYSICAL EXAMINATION:  GENERAL: The patient is critically ill-appearing, sedated on the vent.  VITAL SIGNS: Currently remarkable for a blood pressure of 150/80 with a heart rate of 80 and temperature of 98.1. Saturations are 94% on the ventilator.  HEENT: Normocephalic, atraumatic. Pupils are minimally reactive. Oropharynx revealed severe swelling of the tongue and lips with intubation. NECK: Supple without adenopathy. No obvious thyromegaly is noted. No JVD.  LUNGS: Clear except for some scattered rhonchi. No wheezes or rales. No dullness. Respiratory effort is ventilator initiated.  CARDIAC: Regular rate and rhythm with a normal S1, S2. No significant rubs or gallops. PMI is nondisplaced. Chest wall is nontender.  ABDOMEN: Soft, nontender, with normoactive bowel sounds. No organomegaly or masses were appreciated. No hernias or bruits were noted.  EXTREMITIES: Without clubbing, cyanosis, edema. Pulses were 2+ bilaterally.  SKIN: Warm and dry without rash or lesions.  NEUROLOGIC: Unable to be performed due to the patient's sedation.  PSYCHIATRIC: Unable to be performed due to the same reason.   LABORATORY DATA: Glucose 120 with a BUN of 52, creatinine of 14.83, sodium of 138, and a potassium of 5.8. His GFR was 4. Transaminase is normal. White count was 7.8 with a hemoglobin of 8.9. ABG on the vent revealed a pH  of 7.36 with a pCO2 of 37 and a pO2 of 67 with an FiO2 of 30 and a saturation of 92.4%.   ASSESSMENT:  1. Massive angioedema requiring airway protection with intubation.  2. Type 2 diabetes, on insulin.  3. End-stage renal disease on hemodialysis.  4. Anemia of chronic disease.  5. Benign hypertension, stable.  6. Hyperkalemia.  7. Seizure  disorder.   PLAN: Will continue intubation. Pulmonary and nephrology consults have been ordered. We will switch his Lasix to IV at this time until dialysis can be performed. We will add sliding scale insulin and follow him with Accu-Cheks q. 6 hours while he is n.p.o. The patient does have an orogastric tube. We will continue his oral medications through the nasogastric tube and follow his blood pressure closely. We will follow-up labs in the morning, including a Dilantin level. Dialysis plans per nephrology.  Thank you for the consultation. We will continue to follow this patient while in the hospital. Please call if questions arise.  TOTAL TIME SPENT ON THIS PATIENT: 45 minutes.    ____________________________ Duane Lope Judithann Sheen, MD jds:jh D: 11/16/2013 10:26:21 ET T: 11/16/2013 15:33:16 ET JOB#: [id number]  cc: Duane Lope. Judithann Sheen, MD, <Dictator> Mariam Helbert Rodena Medin MD ELECTRONICALLY SIGNED 11/16/2013 16:49

## 2014-05-13 ENCOUNTER — Ambulatory Visit
Admit: 2014-05-13 | Disposition: A | Discharge status: Home / Self Care | Payer: Self-pay | Attending: Vascular Surgery | Admitting: Vascular Surgery

## 2014-05-13 LAB — POTASSIUM: POTASSIUM: 4.8 mmol/L

## 2014-05-13 LAB — BASIC METABOLIC PANEL
Anion Gap: 12 (ref 7–16)
BUN: 42 mg/dL — ABNORMAL HIGH
CHLORIDE: 101 mmol/L
Calcium, Total: 9.1 mg/dL
Co2: 26 mmol/L
Creatinine: 9.99 mg/dL — ABNORMAL HIGH
GFR CALC AF AMER: 6 — AB
GFR CALC NON AF AMER: 5 — AB
Glucose: 125 mg/dL — ABNORMAL HIGH
Sodium: 139 mmol/L

## 2014-05-13 LAB — APTT: Activated PTT: 25.2 secs (ref 23.6–35.9)

## 2014-05-13 LAB — PROTIME-INR
INR: 1
Prothrombin Time: 12.9 secs

## 2014-05-13 LAB — HEMOGLOBIN: HGB: 13.4 g/dL (ref 13.0–18.0)

## 2014-05-13 NOTE — Discharge Summary (Signed)
PATIENT NAME:  [name] MR#:  [id number] DATE OF BIRTH:  1956/01/10  DATE OF ADMISSION:  11/16/2013 DATE OF DISCHARGE: 12/10/2013  ADMITTING PHYSICIAN: Herschell Dimesichard J. Renae GlossWietSmitty Cordsing, M.D.   DISCHARGING PHYSICIAN: Enid Baasadhika Charlen Bakula, M.D.   PRIMARY CARE PHYSICIAN: None.   CONSULTATIONS IN THE HOSPITAL:  1. Pulmonary critical care consultation by Dr. Freda MunroSaadat Khan.  2. ENT consultation by Dr. Vernie MurdersPaul Juengel. 3. Nephrology consultation by Dr. Wynelle LinkKolluru and Dr. Thedore MinsSingh.  4. Vascular consultation by Dr. Wyn Quakerew and Dr. Gilda CreaseSchnier. 5. ID consultation by Dr. Sampson GoonFitzgerald.   DISCHARGE DIAGNOSES: 1. Acute respiratory failure on admission secondary to severe angioedema likely secondary to ACE inhibitor on presentation.  2. End-stage renal disease with hyperkalemia on Tuesday, Thursday, Saturday hemodialysis schedule.  3. Anemia of chronic disease.  4. Sepsis secondary to arteriovenous graft infection on the right forearm, status post graft removal, and on intravenous antibiotics.  5. 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  6. 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  7. Anemia of chronic disease.  8. Diabetes mellitus.  9. Seizure disorder.  10. Hypertension.  11. Diabetic neuropathy.  12. Metabolic encephalopathy secondary to sepsis, which was transient and resolved.  DISCHARGE MEDICATIONS:  1. Nephro-Vite vitamin B complex 1 tablet p.o. daily.  2. Plavix 75 mg p.o. daily.  3. Sensipar 60 mg p.o. daily.  4. Nortriptyline 50 mg p.o. at bedtime.  5. Protonix 40 mg p.o. b.i.d.  6. Oxycodone 15 mg extended release tablet b.i.d.  7. Oxycodone 5 mg short-acting tablet every 6 hours as needed for severe pain.  8. Clonidine 0.1 mg p.o. b.i.d.  9. Gabapentin 300 mg p.o. 3 times a day.  10. Phenytoin 100 mg at bedtime.  11. Phenytoin 50 mg in the morning.  12. Insulin 70/30 eight units twice a day subcutaneously.  13. Atorvastatin 20 mg p.o. daily.   14. Allopurinol 100 mg every 48 hours.  15. Diaper dermatitis ointment twice a day topically.  16. Eucerin topical ointment twice a day for dry skin.  17. Vancomycin 1 gram with dialysis until 01/09/2014.  18. Senokot 1 tablet p.o. b.i.d.  19. Colace 100 mg p.o. b.i.d.  20. MiraLax powder 17 grams daily p.r.n. for constipation.  21. PhosLo 2 capsules  orally 3 times a day with meals.  22. Minoxidil 2.5 mg 2 tablets once a day in the morning.   DISCHARGE DIET: Renal diet.   DISCHARGE ACTIVITY: As tolerated.    FOLLOWUP INSTRUCTIONS:  1. For dialysis on Saturday, 12/13/2013, per schedule.  2. Nephrology follow-up in 1 week.  3. PCP followup in 1 week.   LABORATORIES AND IMAGING STUDIES PRIOR TO DISCHARGE: WBC 7.4, hemoglobin 8.2, hematocrit 24.1, platelet count 375,000.   Sodium 137, potassium 5.6, chloride 100, bicarbonate 29, BUN 60, creatinine 10.74, glucose 97, and calcium of 8.2.   BRIEF HOSPITAL COURSE: For more details, please look at the history and physical dictated by Dr. Judithann SheenSparks on admission, and interim summary dictated by Dr. Elisabeth PigeonVachhani 11/23/2013, and another interim summary dictated by Dr. Nemiah CommanderKalisetti on 12/01/2013.   In brief, [name] is a 59 year old, African American male, with past medical history significant for hypertension, diabetes, history of CVA, seizure, end-stage renal disease on hemodialysis, who was released from prison recently. He was brought from the nursing home secondary to difficulty breathing secondary to angioedema.   1. Acute respiratory failure secondary to severe angioedema. The likely culprit medication was lisinopril. It was stopped. He was seen by ENT and they had to  do an oral intubation in the OR because of significant tongue swelling. He was treated with steroids and Benadryl, extubated on 11/18/2013, and since then, has not had any respiratory issues.  2. Sepsis and metabolic encephalopathy. On 11/25/2013, the patient had spiking fevers and his  AV graft was noted to be infected. Seen by vascular and had his AV graft taken out on 11/26/2013. His blood cultures were growing MRSA, so had a MRSA infection for which he was started on vancomycin. His repeat blood cultures from 11/30/2013 are negative. Since it is an endovascular infection, ID recommended 6 weeks of antibiotics. He will finish up his vancomycin with dialysis on 01/09/2014. His mental status has improved and the patient has remained afebrile.  3. End-stage renal disease on hemodialysis, presented with hyperkalemia after dialysis. Potassium level has improved. He had a temporary dialysis catheter after the AV graft was removed and that has been taken out, and he had a PermCath placed in the right jugular on 12/01/2013, after his cultures came back negative. He has been having dialysis per schedule. His last dialysis will be prior to discharge on 12/10/2013. Per holiday schedule, his next dialysis is supposed to be on 12/13/2013. He will continue his dialysis with [facility] and continue his vancomycin during dialysis.  4. Anemia of chronic disease, appears stable. He is receiving Procrit with hemodialysis. 5. History of CVA and seizure disorder. Wheelchair-bound at baseline. Continue physical therapy. He is on Plavix, statin . He has not had any seizures in the hospital.  6. Hypertension, on clonidine and minoxidil.  7. Diabetes mellitus. His Novolin has been adjusted. He is on 8 units b.i.d. at this time.  8. Peripheral neuropathy- continue oxycodone.  9. Gout of his right big toe. He is on allopurinol, received colchicine prior to discharge.   His course has been otherwise uneventful in the hospital.   DISCHARGE CONDITION: Stable.   DISCHARGE DISPOSITION: To Educational psychologist.   TIME SPENT ON DISCHARGE: 45 minutes.    ____________________________ Enid Baas, MD rk:JT D: 12/10/2013 14:52:35 ET T: 12/10/2013 15:44:20  ET JOB#: [id number]  cc: Enid Baas, MD, <Dictator> Mosetta Pigeon, MD Enid Baas MD ELECTRONICALLY SIGNED 01/20/2014 13:29

## 2014-05-17 NOTE — Op Note (Signed)
PATIENT NAME:  [name], [name] MR#:  [id number] DATE OF BIRTH:  1955-07-27  DATE OF OPERATION:  04/27/2014.  PREOPERATIVE DIAGNOSES:  1. End-stage renal disease with difficult dialysis access and multiple failed previous grafts.  2. Long-term catheter dependence, right internal jugular vein, greater than 3 months.  3. Hepatitis C.  4. Hypertension.  5. Diabetes.   POSTOPERATIVE DIAGNOSES: 1. End-stage renal disease with difficult dialysis access and multiple failed previous grafts.  2. Long-term catheter dependence, right internal jugular vein, greater than 3 months.  3. Hepatitis C.  4. Hypertension.  5. Diabetes.   PROCEDURES:  1. Ultrasound guidance for vascular access to right brachial vein.  2. Catheter placement into superior vena cava.  3. Right upper extremity and central venogram.  4. Percutaneous transluminal angioplasty of innominate/superior vena cava with 8 and 10 mm diameter angioplasty balloons.   SURGEON:  Annice NeedyJason S. Joseline Mccampbell, M.D.   ANESTHESIA:  Local with moderate conscious sedation.   BLOOD LOSS:  Minimal.   INDICATION FOR PROCEDURE:  A 59 year old gentleman well known to us for his dialysis  access needs.  He has now been catheter dependent for about 4-5 months.  He has had multiple failed previous accesses in both arms.  He is brought in for a venogram of the right arm which is  our preferred arm for access.  He had a loop forearm AV graft excised for infection last year.  The risks and benefits were discussed.  Informed consent was obtained.   DESCRIPTION OF PROCEDURE:  The patient was brought to the vascular suite. The right upper extremity was sterilely prepped and draped and a sterile surgical field was created.  The right brachial vein was visualized with ultrasound and accessed under direct ultrasound guidance just above the antecubital fossa without difficulty with a micropuncture needle.  A micropuncture wire and sheath were placed and a permanent image was recorded.   We then placed a 6-French sheath and gave a small dose of intravenous heparin.  Imaging performed showed the brachial vein and axillary and subclavian veins are mildly patent.  There was reflux of blood backwards with sluggish flow through the innominate and SVC where the catheter was in place.  I advanced a catheter up to the subclavian vein to take selective imaging and there was significant narrowing around the catheter.  The degree of narrowing is very difficult to determine because of the catheter being in place but the actual flow lumen appeared to be only about 20% to 30% the size of the SVC below the catheter equating to a 70% to 80% stenosis.  I crossed the lesion without difficulty with a Magic Torque wire.  I treated it initially with an 8 mm diameter angioplasty balloon and a waste was seen. I then upsized to a 10 mm diameter angioplasty balloon and took selective image in the innominate vein with a Kumpe catheter which showed more brisk flow through there and the narrowing at this point did not appear significant and less than 30%.  At this point, I elected to terminate the procedure.  The sheath was removed.  Pressure was held.  Sterile dressing was placed. The patient tolerated the procedure well.     ____________________________ Annice NeedyJason S. Avan Gullett, MD jsd:kc D: 04/27/2014 14:40:00 ET T: 04/27/2014 22:20:07 ET JOB#: [id number]  cc: Annice NeedyJason S. Miabella Shannahan, MD, <Dictator> Annice NeedyJASON S Cleva Camero MD ELECTRONICALLY SIGNED 05/07/2014 12:50

## 2014-05-17 NOTE — Op Note (Signed)
PATIENT NAME:  [name], [name] MR#:  [id number] DATE OF BIRTH:  11/15/1955  DATE OF PROCEDURE:  05/13/2014  PREOPERATIVE DIAGNOSES: 1. End-stage renal disease.  2. Multiple failed previous dialysis access.  3. Hepatitis C.  4. Hypertension.   POSTOPERATIVE DIAGNOSES: 1. End-stage renal disease.  2. Multiple failed previous dialysis access.  3. Hepatitis C.  4. Hypertension.   PROCEDURE:  Right upper arm brachial artery to axillary vein AV graft with 6 mm Artegraft.   SURGEON: Marlow BaarsJason S. Dew MD   ANESTHESIA: General.   ESTIMATED BLOOD LOSS: 25 mL.   INDICATION FOR PROCEDURE: This is a gentleman who has had multiple previous failed dialysis access. He had an infected graft previously resected. He is currently catheter dependent.   Venogram showed an adequate axillary vein in the right upper extremity for a brachial artery to axillary vein AV graft. We plan to use Artegraft due to his previous infection to reduce the risk of infection. Risks and benefits were discussed. Informed consent was obtained.   DESCRIPTION OF PROCEDURE: The patient is brought to the operative suite and after an adequate level of general anesthesia obtained, the right upper extremity was sterilely prepped and draped, and a sterile surgical field was created. The patient was given vancomycin preoperatively and appropriate surgical timeout was performed. After this, I  started with an incision in the distal upper arm just proximal to his previous brachial artery incision to dissected out the brachial artery.   This was dissected out without difficulty taking care not to injure the nerve or the vein. It was encircled with vessel loops proximally and distally. I then made an axillary incision at this location. I dissected out the axillary vein, which was soft and usable for fistula creation. I then used the most curved tunneler, and tunneled from the axillary incision to the antecubital incision. The patient was then given 4000  units of intravenous heparin for systemic anticoagulation. I selected a 6 mm Artegraft for use for his dialysis access.  The Artegraft was marked for orientation and brought through the tunnel. The arterial anastomosis was performed first.  After control was pulled up on the vessel loops, an anterior wall arteriotomy was created with an 11 blade and extended with Potts scissors. The Artegraft was then cut and beveled to an appropriate length to match the arteriotomy. An anastomosis was created with a running 6-0 Prolene suture in the usual fashion, flushing through the graft with brisk pulsatile inflow. The graft was then clamped. The vein was controlled with bulldog clamps and control was obtained.  An anterior wall venotomy was then created with an 11 blade and extended with Potts scissors. The Artegraft was then cut and beveled to an appropriate length to match the venotomy.  Anastomosis was created with a running 6-0 Prolene suture in the usual fashion. The vessel was flushed and de-aired prior to releasing control. On release, two 6-0 Prolene patch sutures were used for hemostasis and hemostasis was achieved.  There was excellent thrill within the graft. Surgicel and Evicel topical hemostatic agents were placed and hemostasis was complete. The wounds were then closed with 3-0 Vicryl and 4-0 Monocryl. Dermabond was placed as a dressing. The patient was awakened from anesthesia and taken to the recovery room in stable condition, having tolerated the procedure well.    ____________________________ Annice NeedyJason S. Dew, MD jsd:tr D: 05/13/2014 09:48:22 ET T: 05/13/2014 12:11:51 ET JOB#: [id number]  cc: Annice NeedyJason S. Dew, MD, <Dictator> Annice NeedyJASON S DEW MD ELECTRONICALLY SIGNED  05/13/2014 13:55 

## 2014-06-25 ENCOUNTER — Ambulatory Visit: Admission: RE | Admit: 2014-06-25 | Payer: Medicaid Other | Source: Ambulatory Visit | Admitting: Vascular Surgery

## 2014-06-25 ENCOUNTER — Encounter: Admission: RE | Payer: Self-pay | Source: Ambulatory Visit

## 2014-06-25 SURGERY — DIALYSIS/PERMA CATHETER REMOVAL
Anesthesia: Moderate Sedation

## 2014-07-03 ENCOUNTER — Ambulatory Visit
Admission: RE | Admit: 2014-07-03 | Discharge: 2014-07-03 | Disposition: A | Discharge status: Home / Self Care | Payer: Medicaid Other | Source: Ambulatory Visit | Attending: Vascular Surgery | Admitting: Vascular Surgery

## 2014-07-03 ENCOUNTER — Encounter: Payer: Self-pay | Admitting: *Deleted

## 2014-07-03 ENCOUNTER — Encounter
Admission: RE | Disposition: A | Discharge status: Home / Self Care | Payer: Medicaid Other | Source: Ambulatory Visit | Attending: Vascular Surgery

## 2014-07-03 DIAGNOSIS — E1122 Type 2 diabetes mellitus with diabetic chronic kidney disease: Secondary | ICD-10-CM | POA: Insufficient documentation

## 2014-07-03 DIAGNOSIS — Z7902 Long term (current) use of antithrombotics/antiplatelets: Secondary | ICD-10-CM | POA: Diagnosis not present

## 2014-07-03 DIAGNOSIS — I120 Hypertensive chronic kidney disease with stage 5 chronic kidney disease or end stage renal disease: Secondary | ICD-10-CM | POA: Diagnosis not present

## 2014-07-03 DIAGNOSIS — K219 Gastro-esophageal reflux disease without esophagitis: Secondary | ICD-10-CM | POA: Insufficient documentation

## 2014-07-03 DIAGNOSIS — Z4901 Encounter for fitting and adjustment of extracorporeal dialysis catheter: Secondary | ICD-10-CM | POA: Diagnosis present

## 2014-07-03 DIAGNOSIS — Z79899 Other long term (current) drug therapy: Secondary | ICD-10-CM | POA: Diagnosis not present

## 2014-07-03 DIAGNOSIS — M1A9XX0 Chronic gout, unspecified, without tophus (tophi): Secondary | ICD-10-CM | POA: Insufficient documentation

## 2014-07-03 DIAGNOSIS — Z79891 Long term (current) use of opiate analgesic: Secondary | ICD-10-CM | POA: Diagnosis not present

## 2014-07-03 DIAGNOSIS — Z794 Long term (current) use of insulin: Secondary | ICD-10-CM | POA: Insufficient documentation

## 2014-07-03 DIAGNOSIS — F329 Major depressive disorder, single episode, unspecified: Secondary | ICD-10-CM | POA: Diagnosis not present

## 2014-07-03 DIAGNOSIS — Z951 Presence of aortocoronary bypass graft: Secondary | ICD-10-CM | POA: Diagnosis not present

## 2014-07-03 DIAGNOSIS — N186 End stage renal disease: Secondary | ICD-10-CM | POA: Diagnosis not present

## 2014-07-03 HISTORY — PX: PERIPHERAL VASCULAR CATHETERIZATION: SHX172C

## 2014-07-03 HISTORY — DX: Gastro-esophageal reflux disease without esophagitis: K21.9

## 2014-07-03 HISTORY — DX: Cardiac arrhythmia, unspecified: I49.9

## 2014-07-03 HISTORY — DX: Chronic kidney disease, unspecified: N18.9

## 2014-07-03 HISTORY — DX: Essential (primary) hypertension: I10

## 2014-07-03 SURGERY — DIALYSIS/PERMA CATHETER REMOVAL
Anesthesia: Moderate Sedation

## 2014-07-03 MED ORDER — BACITRACIN-NEOMYCIN-POLYMYXIN 400-5-5000 EX OINT
TOPICAL_OINTMENT | CUTANEOUS | Status: AC
  Filled 2014-07-03: qty 1

## 2014-07-03 MED ORDER — LIDOCAINE-EPINEPHRINE (PF) 1 %-1:200000 IJ SOLN
INTRAMUSCULAR | Status: AC
  Filled 2014-07-03: qty 30

## 2014-07-03 SURGICAL SUPPLY — 2 items
KIT REMOVAL (MISCELLANEOUS) ×3 IMPLANT
TOWEL OR 17X26 4PK STRL BLUE (TOWEL DISPOSABLE) ×3 IMPLANT

## 2014-07-03 NOTE — H&P (Signed)
[facility] History & Physical Update  The patient was interviewed and re-examined.  The patient's previous History and Physical has been reviewed and is unchanged.  There is no change in the plan of care.  Manal Kreutzer, Latina Craver, MD  07/03/2014, 9:36 AM

## 2014-07-03 NOTE — Op Note (Signed)
  OPERATIVE NOTE   PROCEDURE: 1. Removal of a right IJ tunneled dialysis catheter  PRE-OPERATIVE DIAGNOSIS: Complication of dialysis catheter, End stage renal disease  POST-OPERATIVE DIAGNOSIS: Same  SURGEON: Schnier, Latina Craver, M.D.  ANESTHESIA: Local anesthetic with 1% lidocaine with epinephrine   ESTIMATED BLOOD LOSS: Minimal   FINDING(S): 1. Catheter intact   SPECIMEN(S):  Catheter  INDICATIONS:   [name] is a 59 y.o. male who presents with functioning right arm access and a poorly functioning right internal jugular tunneled catheter.  The patient has undergone placement of an extremity access which is working and this has been successfully cannulated without difficulty.  therefore is undergoing removal of his tunneled catheter which is no longer needed to avoid septic complications.   DESCRIPTION: After obtaining full informed written consent, the patient was positioned supine. The right IJ tunneled catheter and surrounding area is prepped and draped in a sterile fashion. The cuff was localized by palpation and noted to be less than 3 cm from the exit site. After appropriate timeout is called, 1% lidocaine with epinephrine is infiltrated into the surrounding tissues around the cuff. Small transverse incision is created at the exit site with an 11 blade scalpel and the dissection was carried up along the catheter to expose the cuff of the tunneled catheter.  The catheter cuff is then freed from the surrounding attachments and adhesions. Once the catheter has been freed circumferentially it is removed in 1 piece. Light pressure was held at the base of the neck.   Antibiotic ointment and a sterile dressing is applied to the exit site. Patient tolerated procedure well and there were no complications.  COMPLICATIONS: None  CONDITION: Unchanged  Schnier, Latina Craver, M.D. [facility] Office: [phone]  07/03/2014,9:39 AM

## 2014-07-21 ENCOUNTER — Encounter: Payer: Self-pay | Admitting: Vascular Surgery

## 2015-05-17 DEATH — deceased

## 2016-06-08 IMAGING — CT CT ABD-PELV W/O CM
2 of 4 series · 16 of 46 positions shown, 18 images · non-contrast
Comparison: None.

CLINICAL DATA: Nausea and vomiting, feels constipated, history of
dialysis, Initial visit

EXAM:
CT ABDOMEN AND PELVIS WITHOUT CONTRAST
TECHNIQUE: Multidetector CT imaging of the abdomen and pelvis was performed
following the standard protocol without IV contrast.

[Series 2: routine abd pel without · axial · non-contrast · 0.74mm/px · z∈[-476,-46]mm · 13 of 94 slices shown, 15 images]
[im 4/94  soft-tissue]
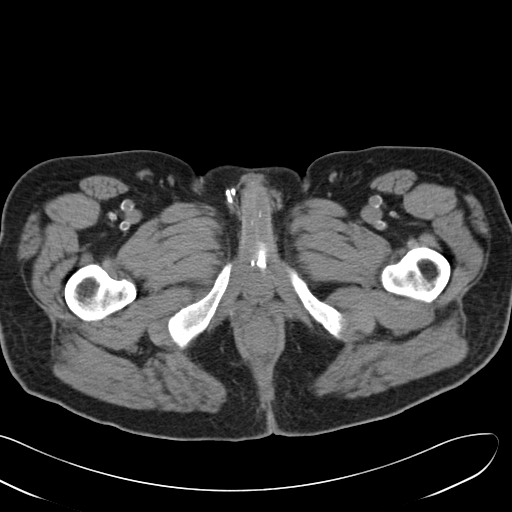
[im 4/94  bone]
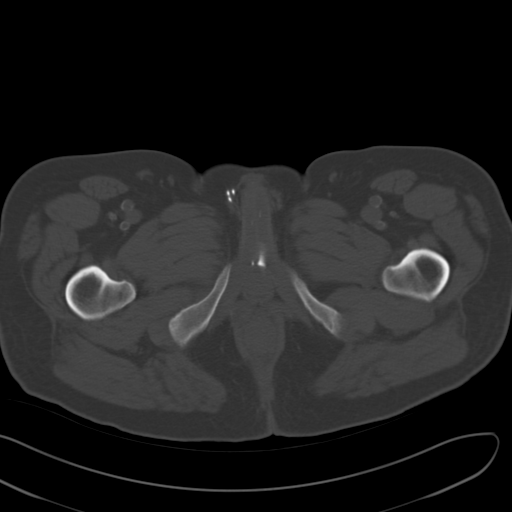
[im 12/94  soft-tissue]
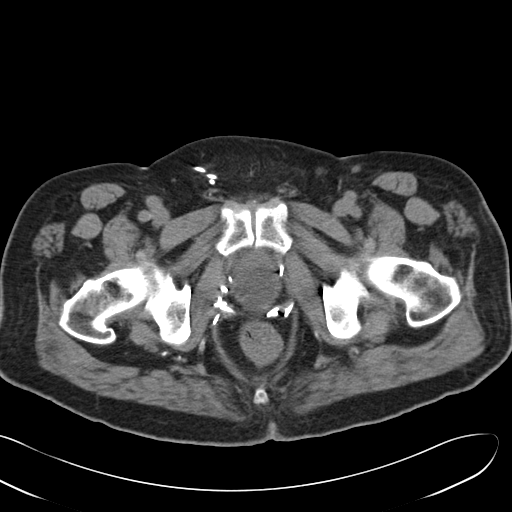
[im 20/94  soft-tissue]
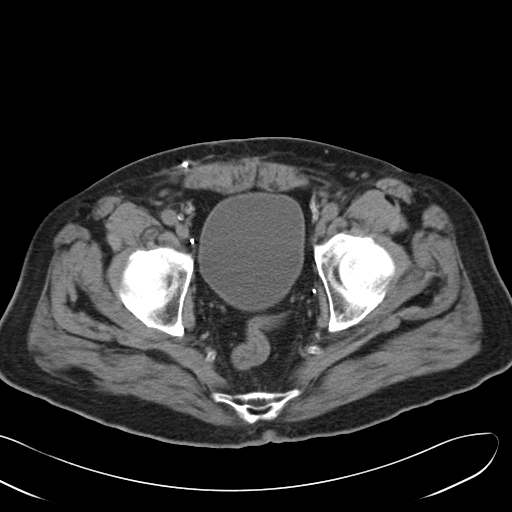
[im 28/94  soft-tissue]
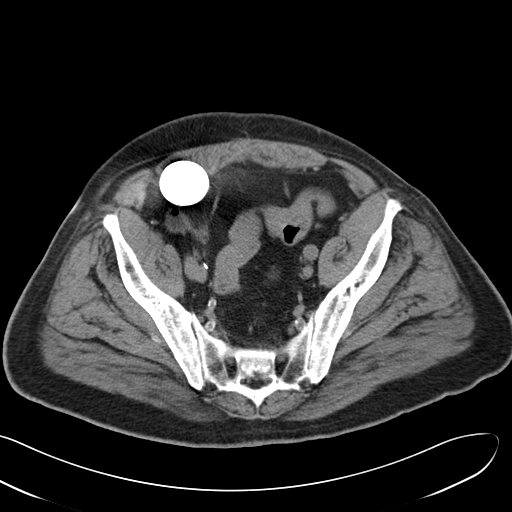
[im 32/94  soft-tissue]
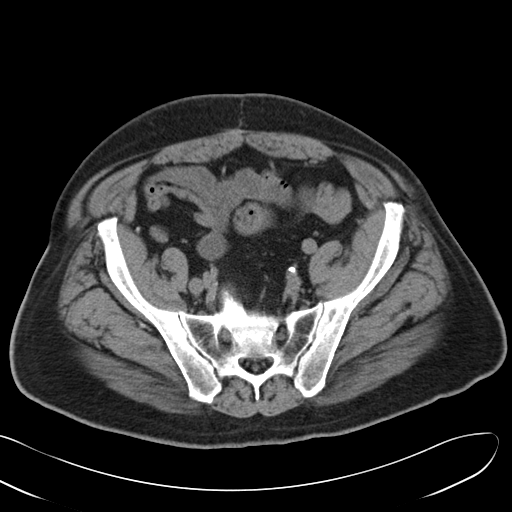
[im 39/94  soft-tissue]
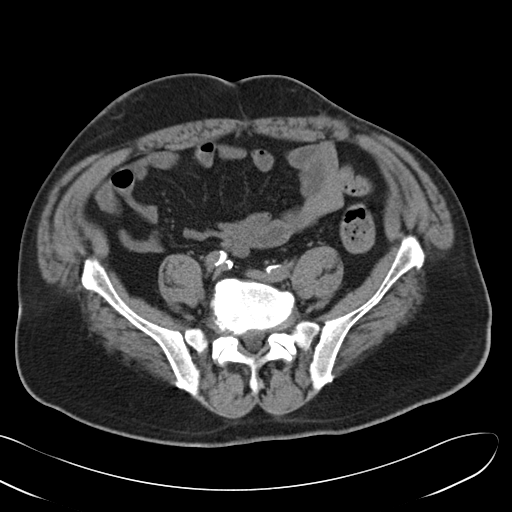
[im 47/94  soft-tissue]
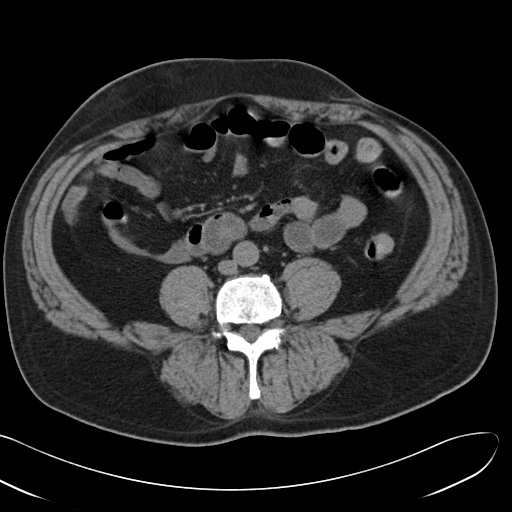
[im 55/94  soft-tissue]
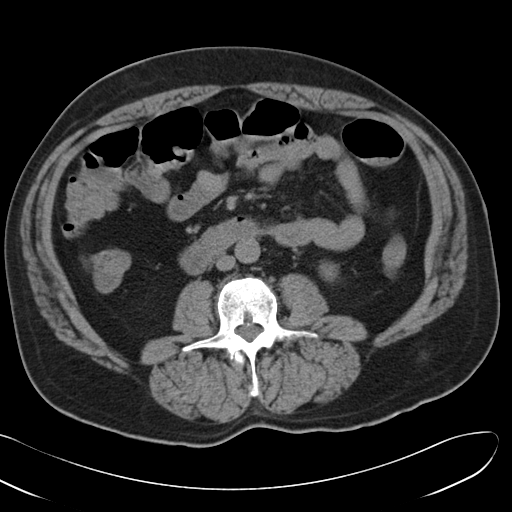
[im 63/94  soft-tissue]
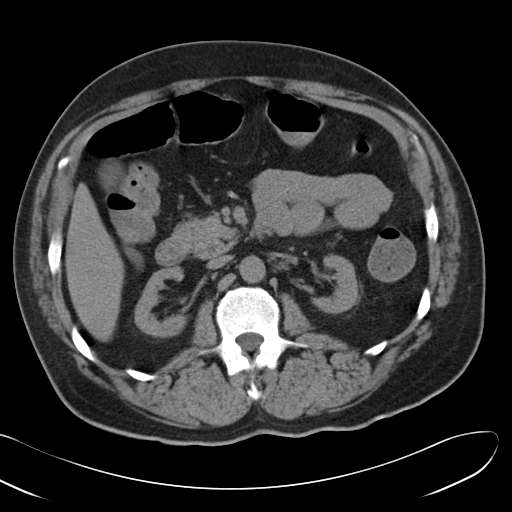
[im 63/94  bone]
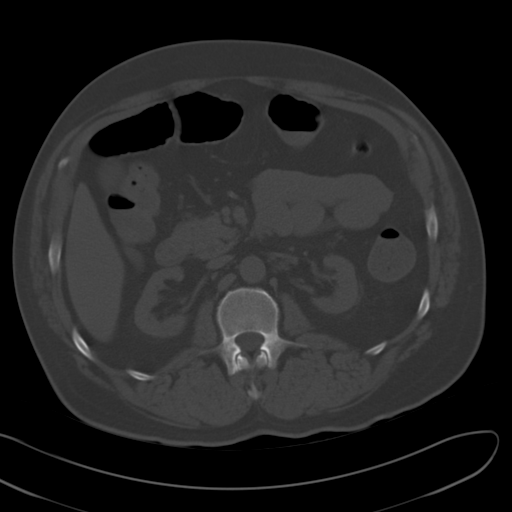
[im 66/94  soft-tissue]
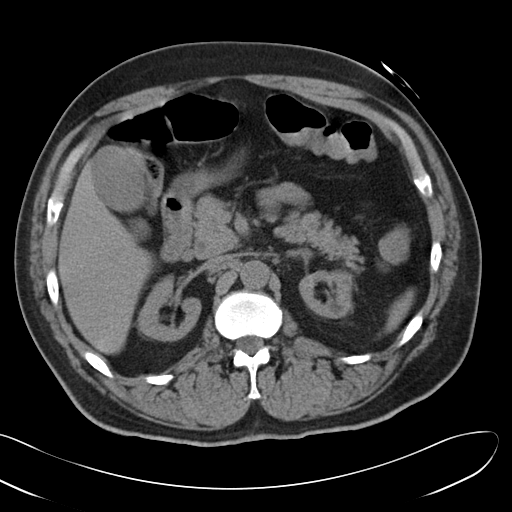
[im 74/94  soft-tissue]
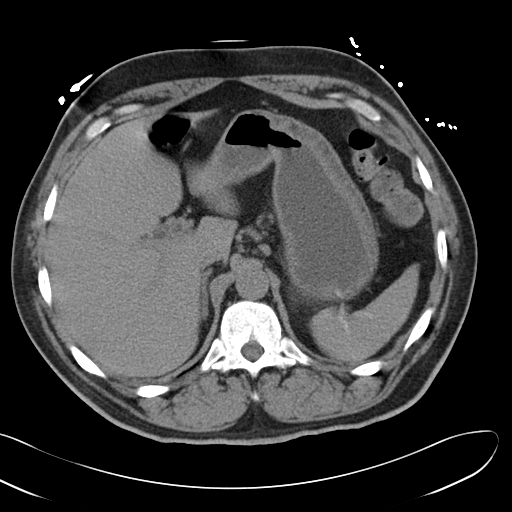
[im 82/94  soft-tissue]
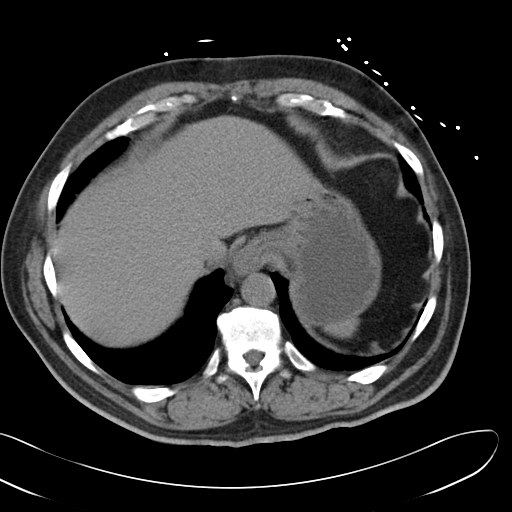
[im 90/94  soft-tissue]
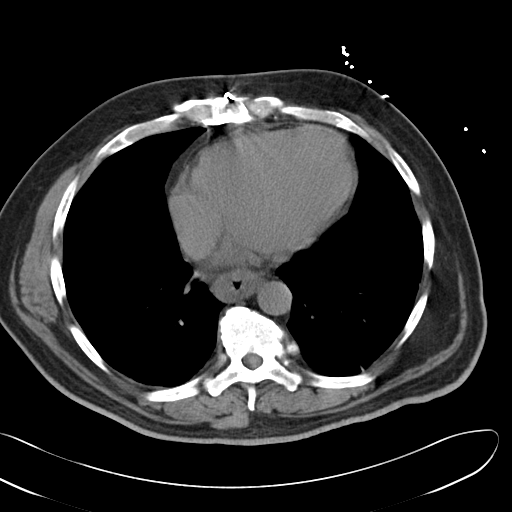

[Series 5: cor routine abd pel wo · coronal · 0.71mm/px · 3 of 144 slices shown]
[im 48/144  soft-tissue]
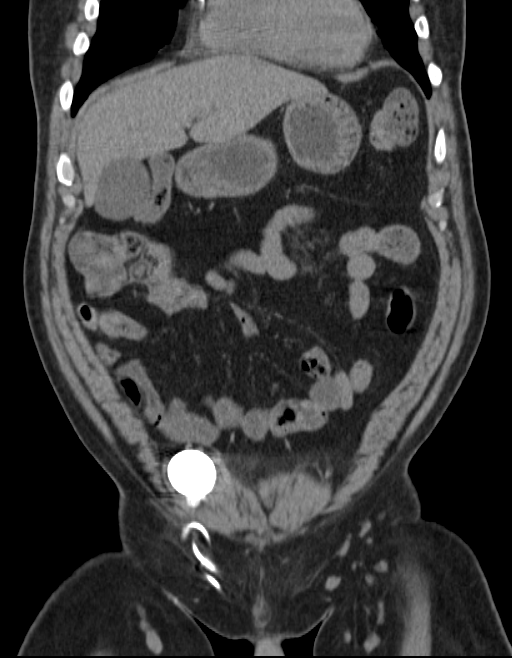
[im 64/144  soft-tissue]
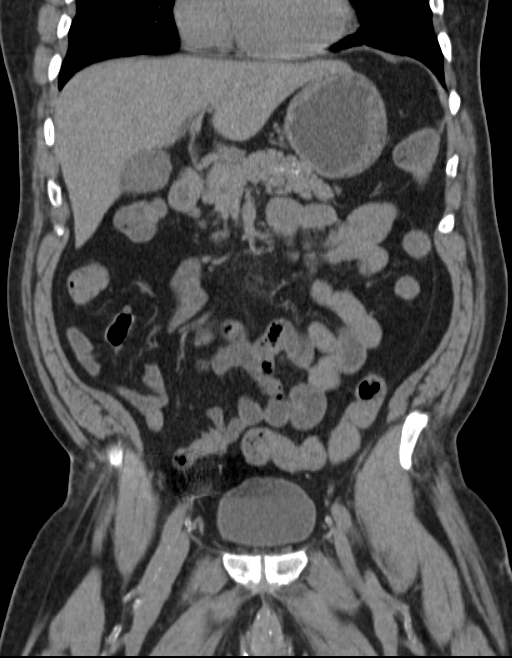
[im 80/144  soft-tissue]
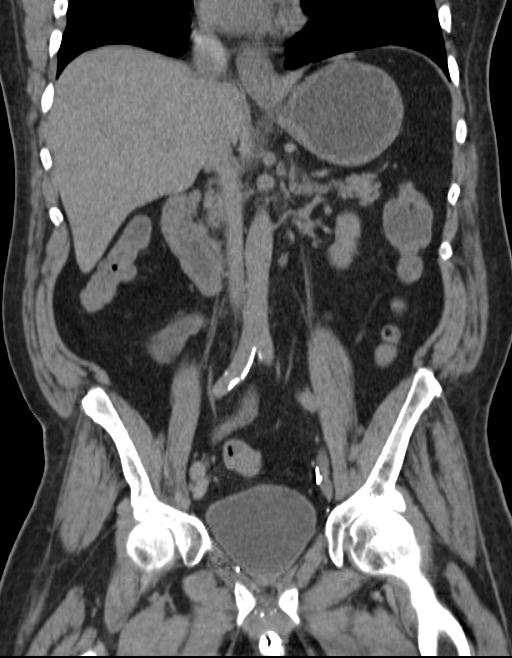

[16 of 46 positions shown; findings below may reference images not displayed]

FINDINGS: The visualized portions of the lung bases are clear. There is a
small hiatal hernia. There is distal esophageal wall thickening.

Liver and gallbladder are normal. Spleen is normal. Pancreas is
normal.

Adrenal glands are normal. There is bilateral renal atrophy. There
are no acute renal abnormalities. Both kidneys demonstrate a 1 cm
low-attenuation lesion, which are not fully evaluated without
contrast but which may represent cysts.

Stomach is normal. Small bowel is normal. Appendix is normal. The
large bowel is normal. There does not appear to be a significant
stool burden.

There is a penile prosthesis with reservoir in the right pelvis.
Bladder is normal. There is no ascites. There is no significant
adenopathy. There are no acute musculoskeletal findings. There is a
large antral lateral L5-S1 osteophyte. There are numerous lower
thoracic spine osteophytes.
IMPRESSION: Nonobstructive gas pattern, with no evidence of significant fecal
retention. There are nonacute findings as described above including
bilateral renal atrophy and low attenuation renal lesions which are
not fully characterized by this examination.

## 2016-06-10 IMAGING — CR DG CHEST 1V PORT
1 series · 1 of 1 positions shown · non-contrast
Comparison: 10/20/2013

CLINICAL DATA: Central line placement

EXAM:
PORTABLE CHEST - 1 VIEW

[ap]
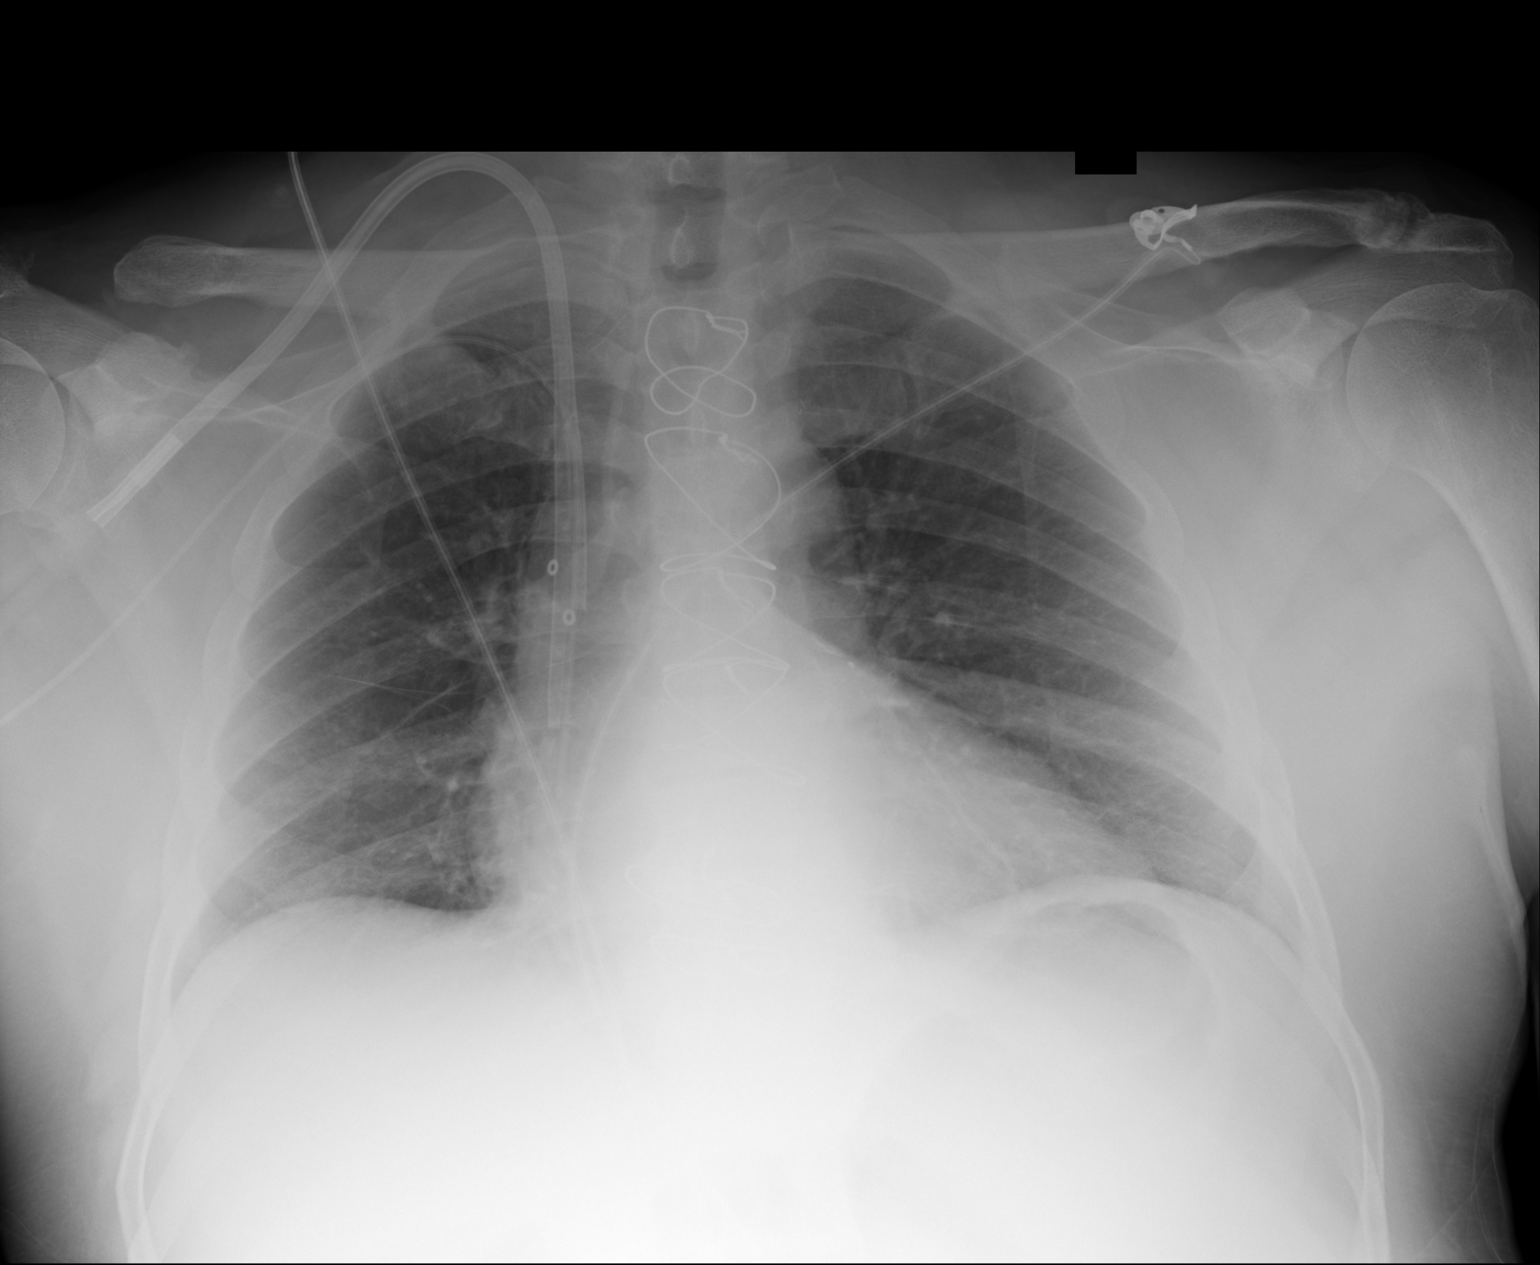

[1 of 1 positions shown; findings below may reference images not displayed]

FINDINGS: Right jugular dual-lumen catheter tips in the SVC and SVC/RA
junction in good position. No pneumothorax

Right arm PICC tip in the right atrium.

Prior CABG. Heart size upper normal. Negative for heart failure.
Negative for infiltrate or effusion.
IMPRESSION: Dialysis catheter in the mid to lower SVC in good position. No
pneumothorax

Right arm PICC tip in the proximal right atrium.
